# Patient Record
Sex: Female | Born: 1985 | Race: White | Hispanic: No | Marital: Married | Smoking: Former smoker
Health system: Southern US, Community
[De-identification: ages and names within clinical notes are randomized; demographics above are authoritative.]

## PROBLEM LIST (undated history)

## (undated) DIAGNOSIS — Z9889 Other specified postprocedural states: Secondary | ICD-10-CM

## (undated) DIAGNOSIS — R112 Nausea with vomiting, unspecified: Secondary | ICD-10-CM

## (undated) DIAGNOSIS — E282 Polycystic ovarian syndrome: Secondary | ICD-10-CM

## (undated) HISTORY — PX: TONSILLECTOMY: SUR1361

---
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
## 2007-03-30 ENCOUNTER — Inpatient Hospital Stay (HOSPITAL_COMMUNITY): Admission: AD | Admit: 2007-03-30 | Discharge: 2007-03-30 | Payer: Self-pay | Admitting: Obstetrics & Gynecology

## 2007-05-16 ENCOUNTER — Emergency Department (HOSPITAL_COMMUNITY): Admission: EM | Admit: 2007-05-16 | Discharge: 2007-05-16 | Payer: Self-pay | Admitting: Emergency Medicine

## 2007-05-26 ENCOUNTER — Ambulatory Visit (HOSPITAL_COMMUNITY): Admission: RE | Admit: 2007-05-26 | Discharge: 2007-05-26 | Payer: Self-pay | Admitting: Obstetrics

## 2007-07-30 ENCOUNTER — Ambulatory Visit (HOSPITAL_COMMUNITY): Admission: RE | Admit: 2007-07-30 | Discharge: 2007-07-30 | Payer: Self-pay | Admitting: Obstetrics

## 2007-10-28 ENCOUNTER — Inpatient Hospital Stay (HOSPITAL_COMMUNITY): Admission: AD | Admit: 2007-10-28 | Discharge: 2007-11-02 | Payer: Self-pay | Admitting: Obstetrics & Gynecology

## 2007-11-03 ENCOUNTER — Encounter: Admission: RE | Admit: 2007-11-03 | Discharge: 2007-12-02 | Payer: Self-pay | Admitting: Obstetrics

## 2008-05-13 IMAGING — US US OB COMP LESS 14 WK
1 series · 14 of 28 positions shown · non-contrast
Comparison: none

CLINICAL DATA: Abnormal uterine bleeding.  Early pregnancy.  
 OBSTETRICAL ULTRASOUND <14 WKS:
TECHNIQUE: Transabdominal ultrasound was performed for evaluation of the gestation as well as the maternal uterus and adnexal regions.

[Series 1: us ob comp less 14 wks · 14 of 29 slices shown]
[im 2/29]
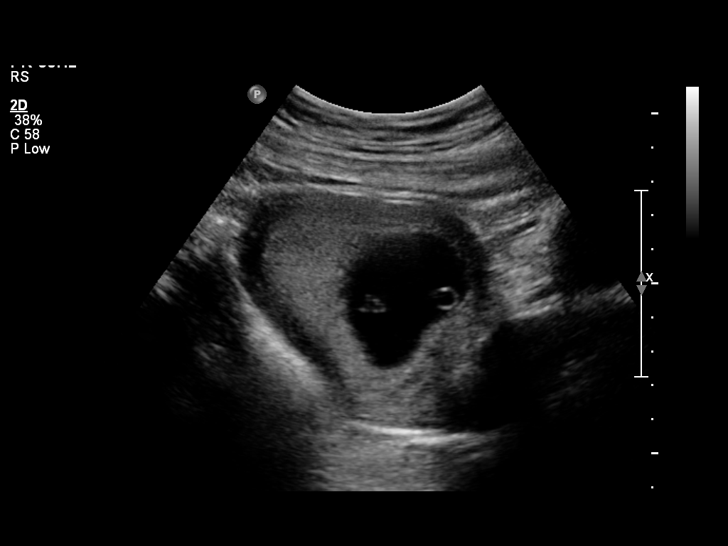
[im 4/29]
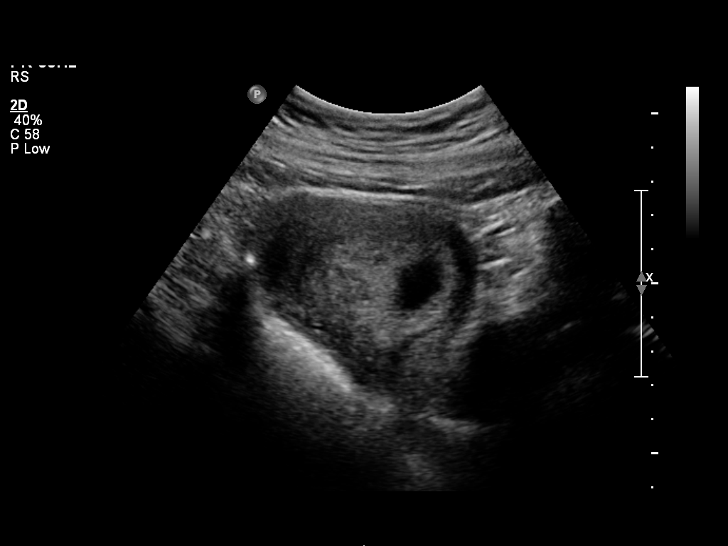
[im 6/29]
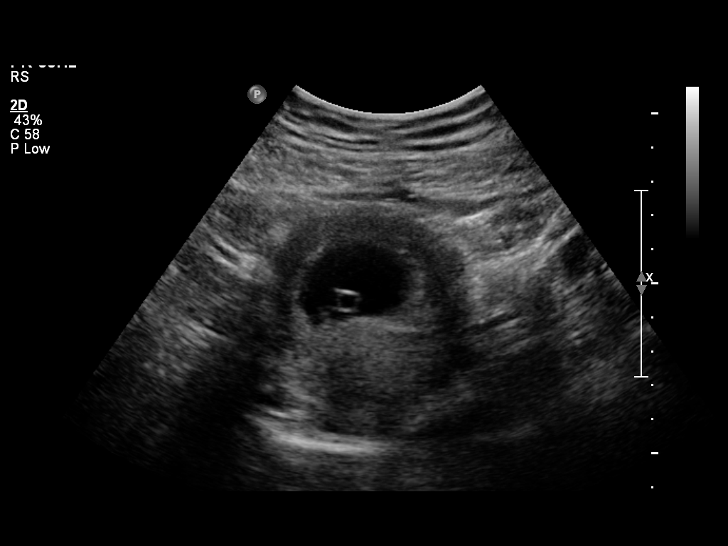
[im 8/29]
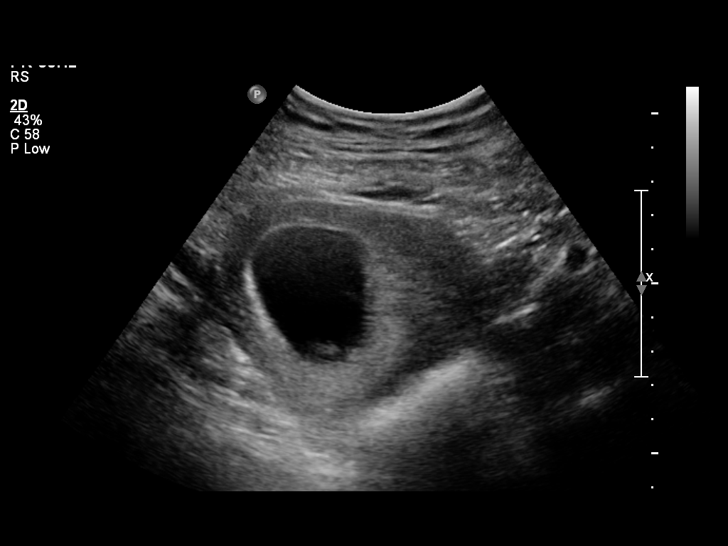
[im 10/29]
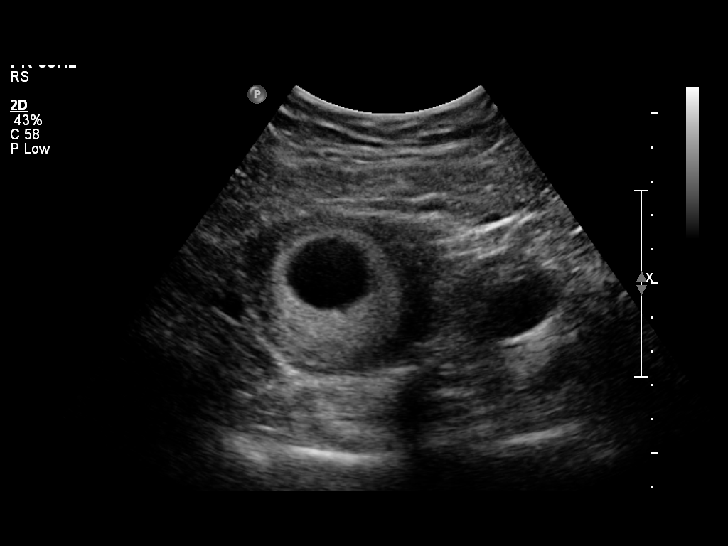
[im 12/29]
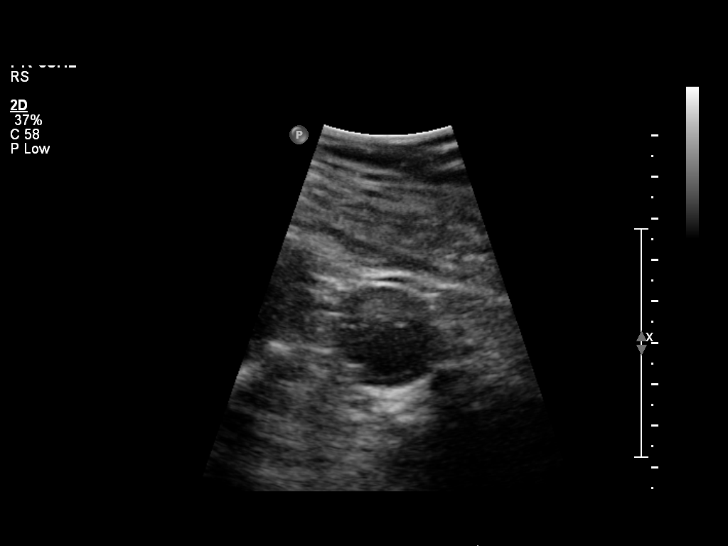
[im 14/29]
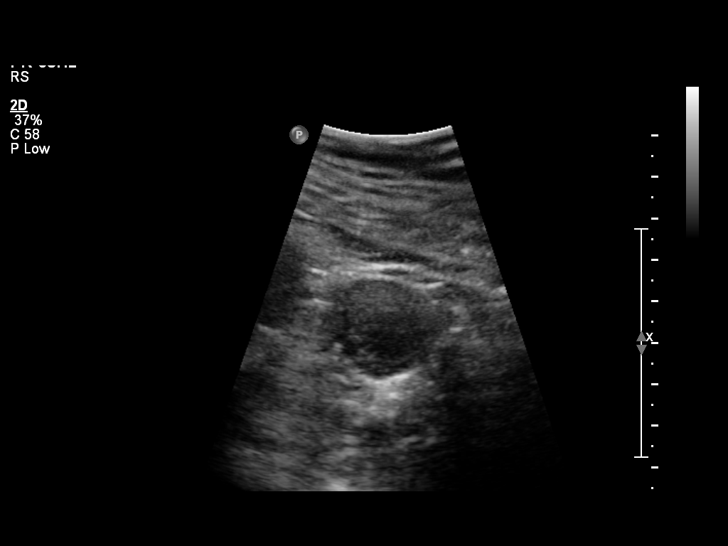
[im 16/29]
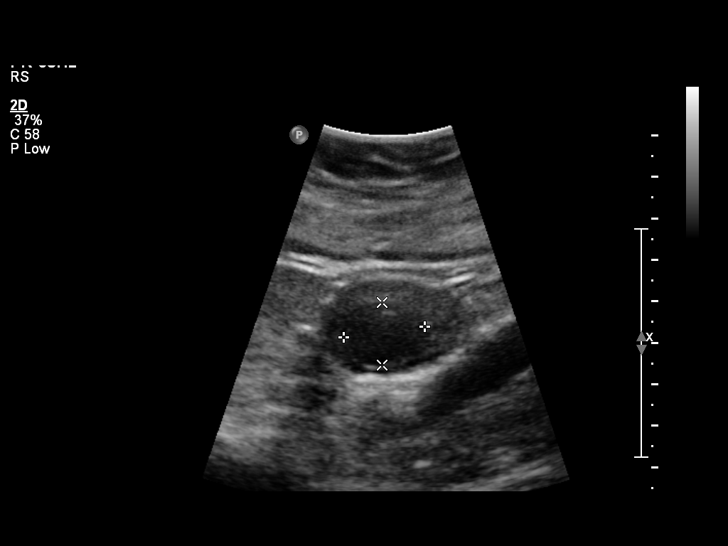
[im 18/29]
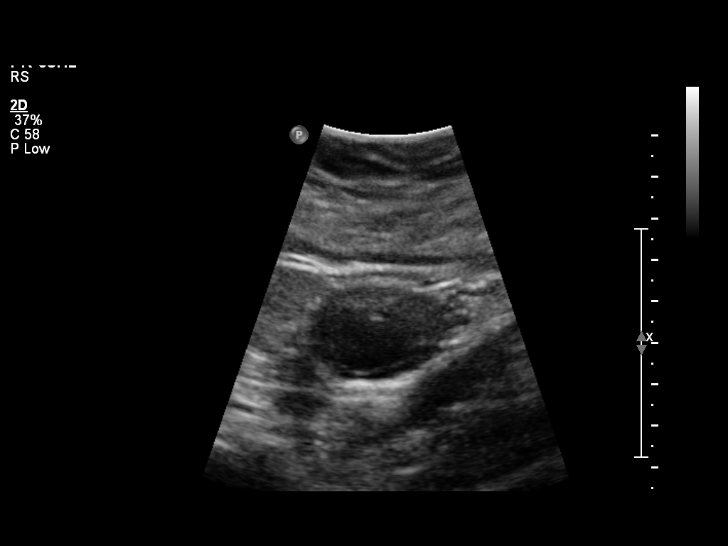
[im 20/29]
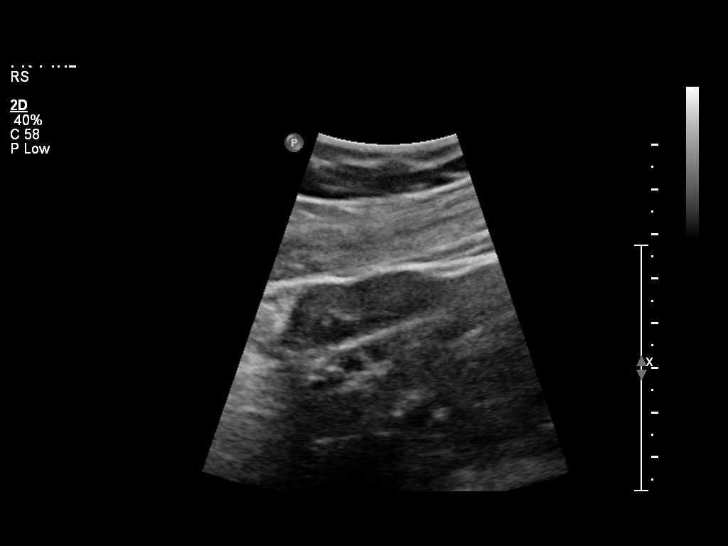
[im 22/29]
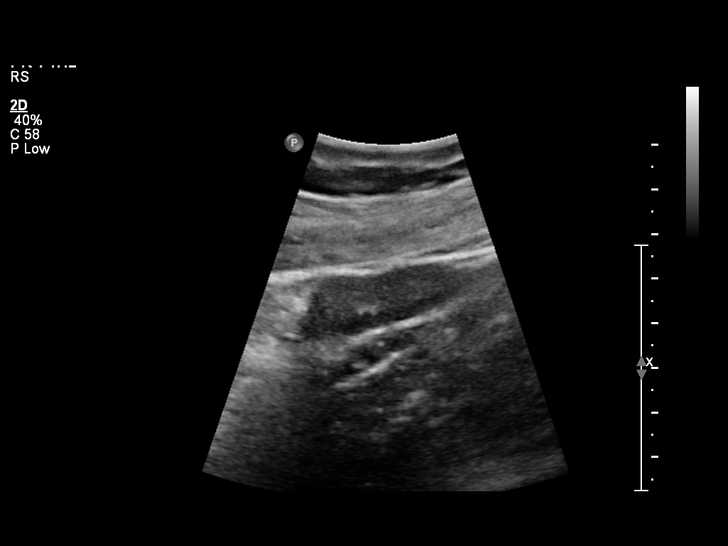
[im 24/29]
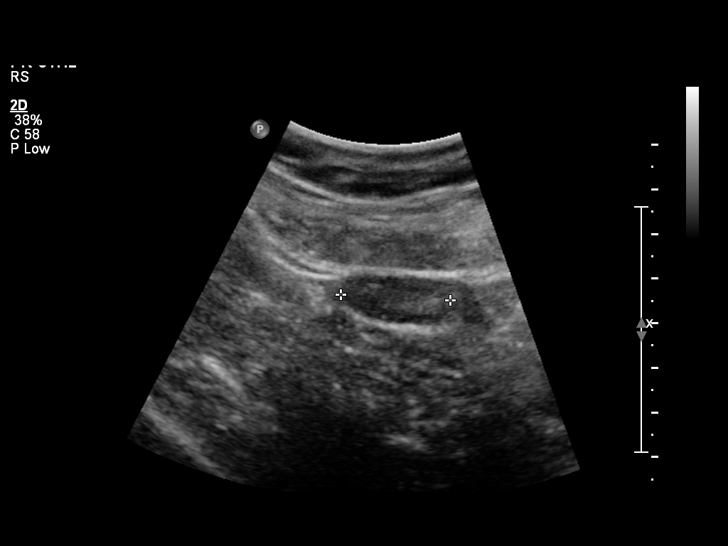
[im 26/29]
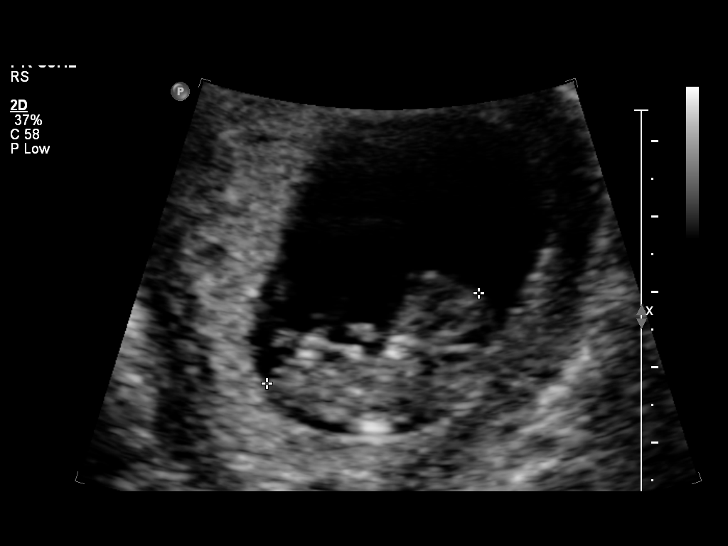
[im 29/29]
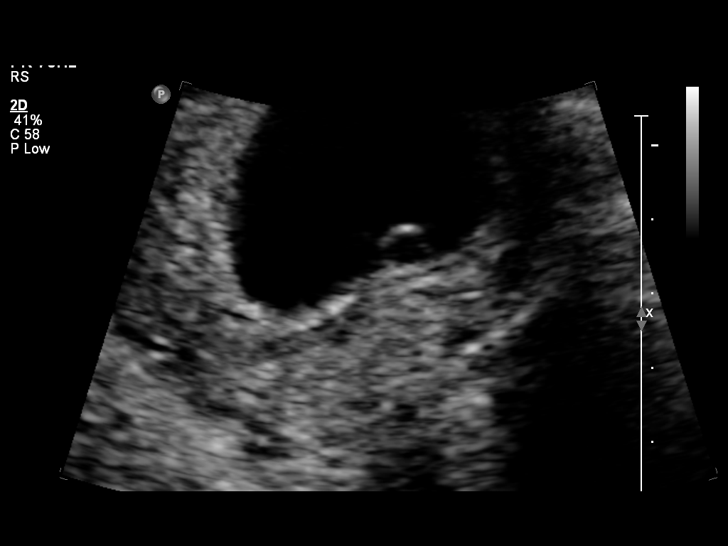

[14 of 28 positions shown; findings below may reference images not displayed]

FINDINGS: There is a single intrauterine gestation.  Yolk sac, embryo, and cardiac activity are visible.  Heart rate is 172 bpm.  Crown-rump length is 3.06 cm consistent with 10 weeks 0 days gestation.  
 No subchorionic hemorrhage.  The ovaries are normal.  No free fluid.
IMPRESSION: Normal appearing single intrauterine pregnancy of approximately 10 weeks 0 days gestation.  Specifically, no subchorionic hemorrhage.

## 2008-09-12 IMAGING — US US ABDOMEN COMPLETE
1 series · 14 of 25 positions shown · non-contrast
Comparison: None

CLINICAL DATA: Right upper quadrant abdominal pain.  28 weeks
pregnant.

COMPLETE ABDOMINAL ULTRASOUND
TECHNIQUE: Complete abdominal ultrasound examination was performed
including evaluation of the liver, gallbladder, bile ducts,
pancreas, kidneys, spleen, IVC, and abdominal aorta.

[Series 1: us abdomen complete · 0.30mm/px · 14 of 60 slices shown]
[im 1/60]
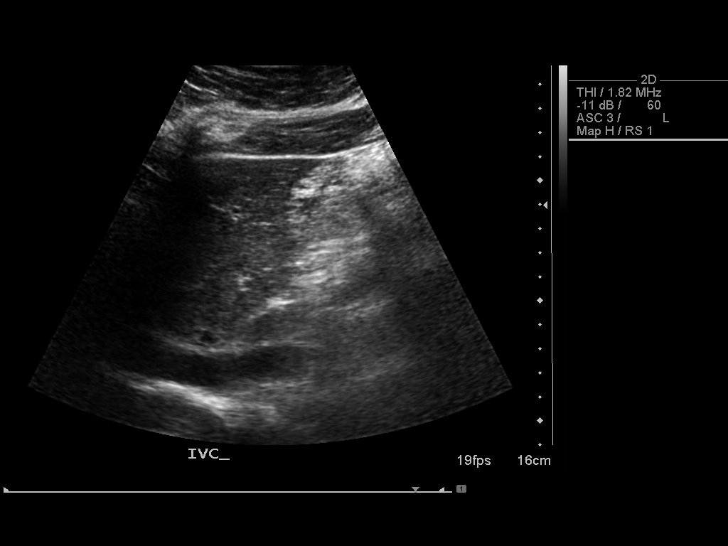
[im 5/60]
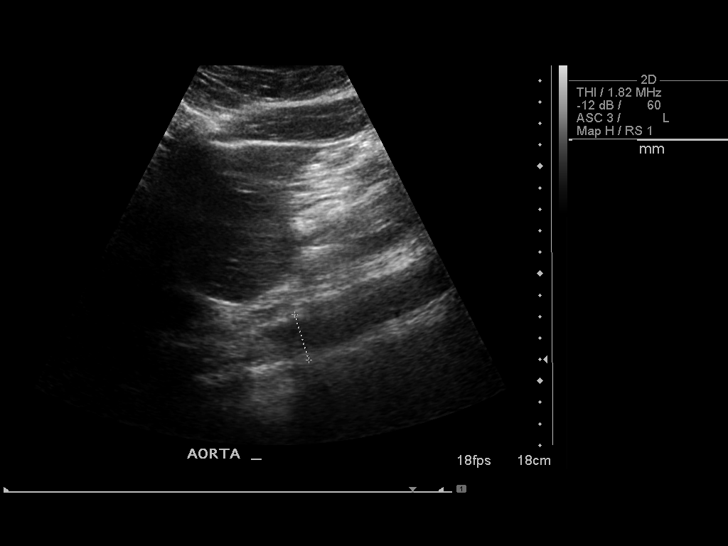
[im 10/60]
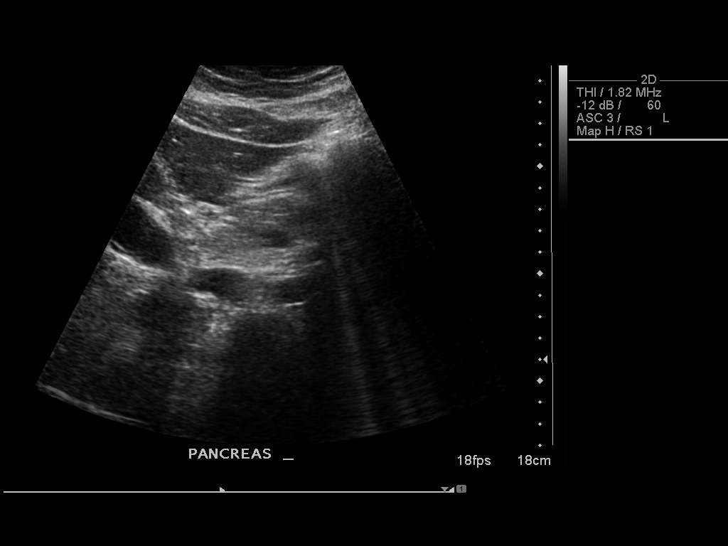
[im 15/60]
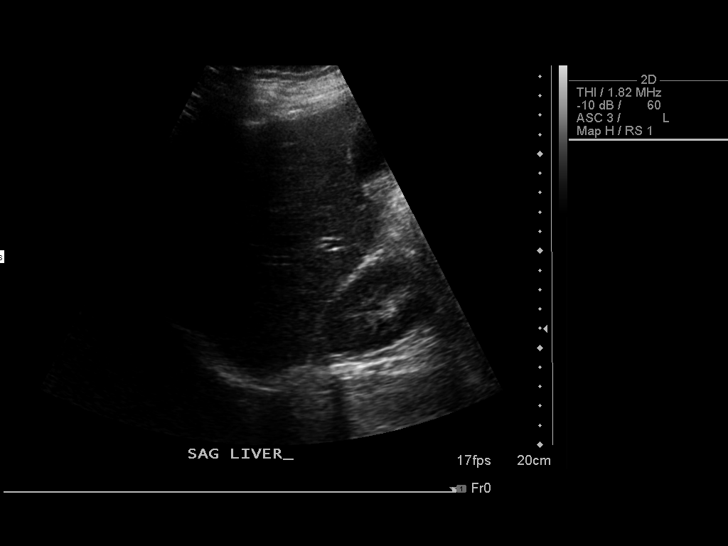
[im 20/60]
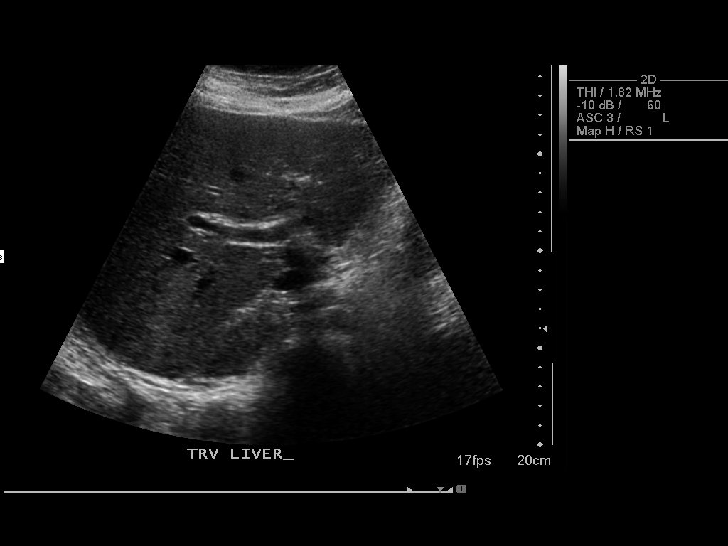
[im 23/60]
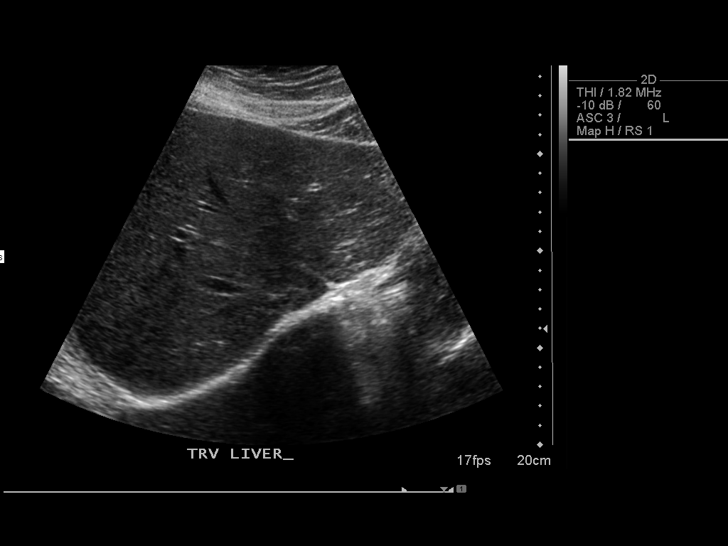
[im 28/60]
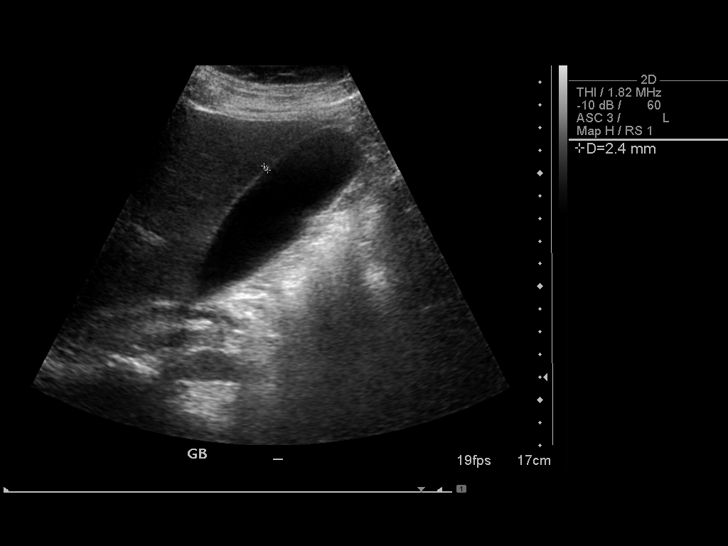
[im 32/60]
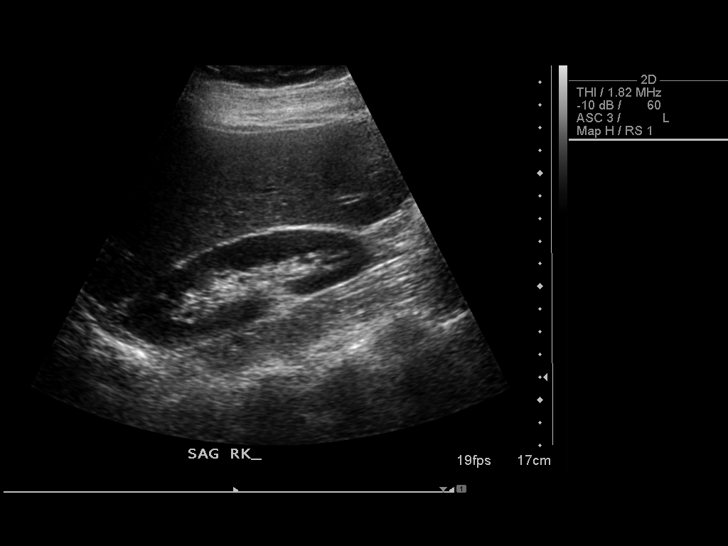
[im 37/60]
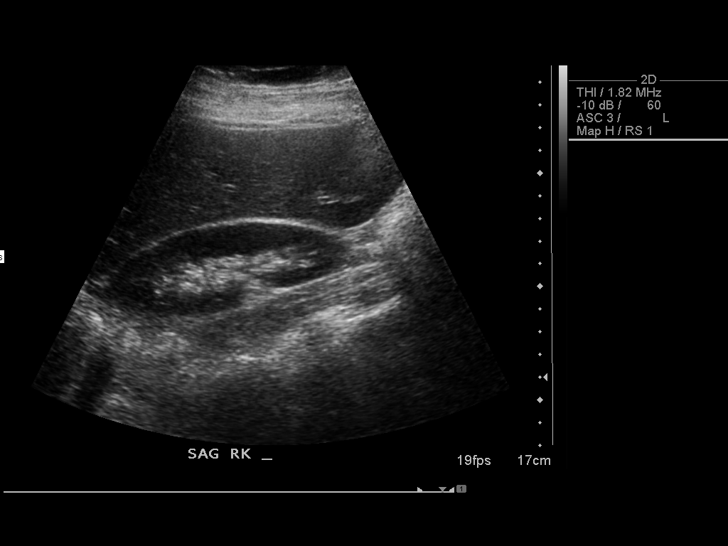
[im 40/60]
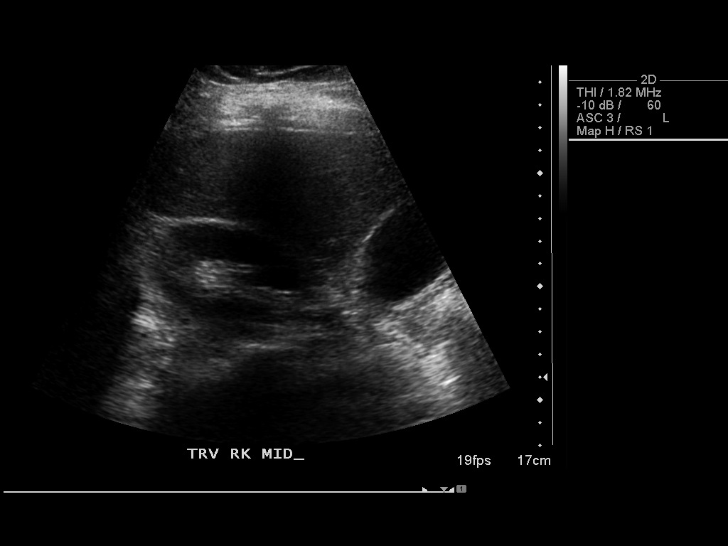
[im 45/60]
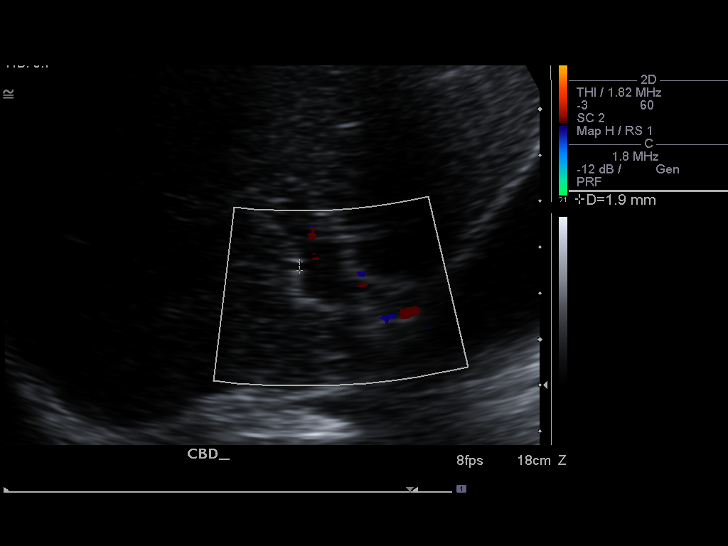
[im 50/60]
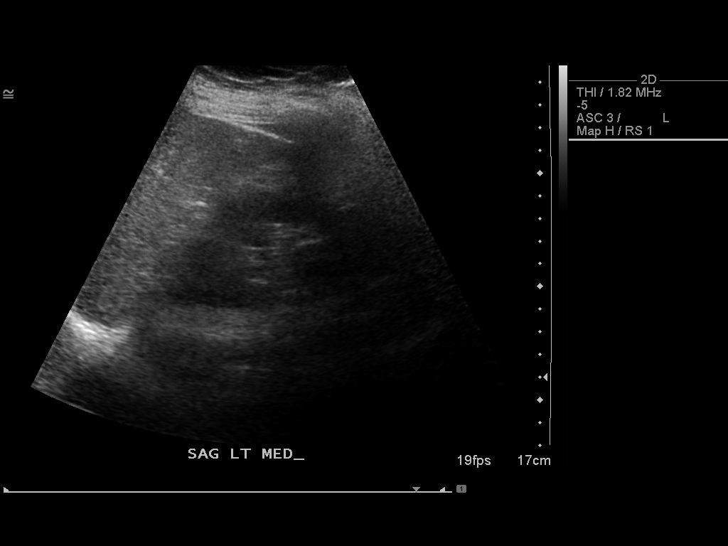
[im 55/60]
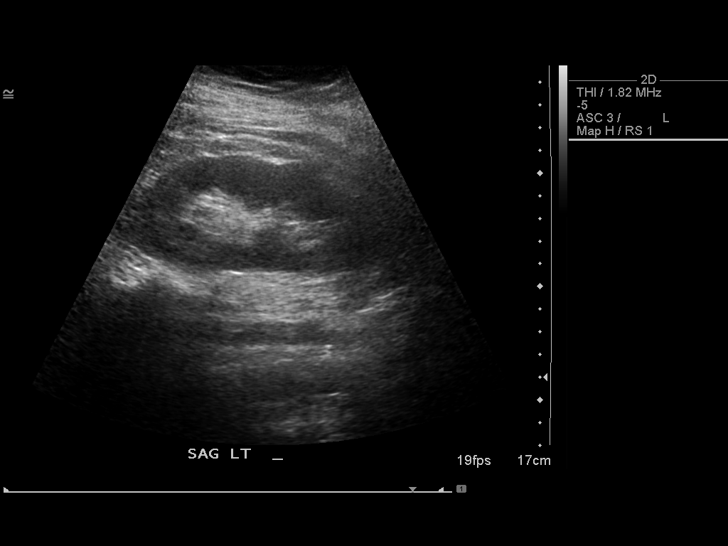
[im 60/60]
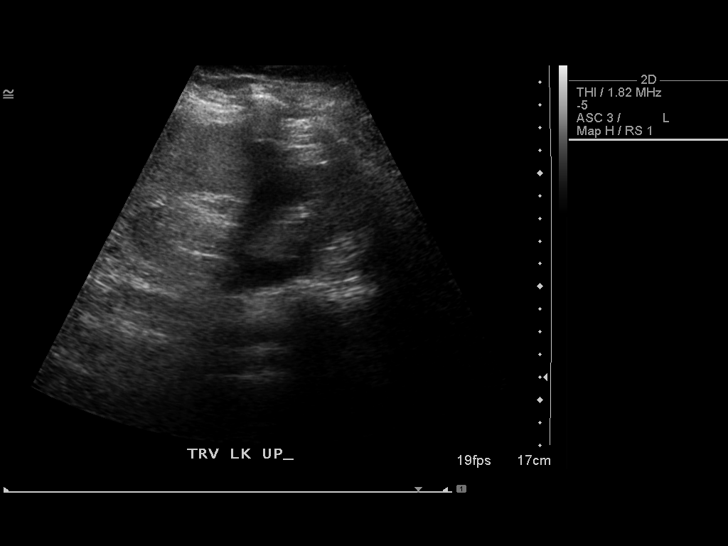

[14 of 25 positions shown; findings below may reference images not displayed]

FINDINGS: Gallbladder:  No gallstones, gallbladder wall thickening, or
pericholecystic fluid.

Common bile duct: Within normal limits in caliber.

Liver:  No focal parenchymal abnormalities.  Within normal limits
in parenchymal echogenicity.

Inferior vena cava:  Visualized portion unremarkable.

Pancreas:  Visualized portion unremarkable.

Spleen:  Within normal limits in size and echogenicity.

Right kidney:  Within normal limits in size and echogenicity. No
evidence of mass or hydronephrosis.

Left kidney:  Within normal limits in size and echogenicity. No
evidence of mass or hydronephrosis.

Abdominal aorta:  Within normal limits in caliber.
IMPRESSION: Negative abdominal ultrasound. No evidence of gallstones or
hydronephrosis.

## 2010-07-04 NOTE — Op Note (Signed)
[name], [name]               ACCOUNT NO.:  [id number]   MEDICAL RECORD NO.:  [id number]          PATIENT TYPE:  INP   LOCATION:  9142                          FACILITY:  [facility]   PHYSICIAN:  Roseanna Rainbow, M.D.DATE OF BIRTH:  [birth year]   DATE OF PROCEDURE:  10/30/2007  DATE OF DISCHARGE:                               OPERATIVE REPORT   PREOPERATIVE DIAGNOSIS:  Intrauterine pregnancy at term, latent labor,  and suspicious fetal heart tracing with repetitive moderate variable  decelerations.   POSTOPERATIVE DIAGNOSIS:  Intrauterine pregnancy at term, latent labor,  and suspicious fetal heart tracing with repetitive moderate variable  decelerations.   PROCEDURE:  Primary low uterine flap elliptical cesarean delivery via  Pfannenstiel skin incision.   SURGEON:  Roseanna Rainbow, M.D.   ANESTHESIA:  Epidural.   ESTIMATED BLOOD LOSS:  600 mL.   COMPLICATIONS:  None.   IV FLUID AND URINE OUTPUT:  As per anesthesiology.   PROCEDURE:  The patient was taken to the operating room with an IV  running in an epidural catheter in situ.  The patient was placed in the  dorsal supine position with leftward tilt and prepped and draped in the  usual sterile fashion.  After the time out had been completed, the  incision site was infiltrated with 10 mL of 0.25% Marcaine.  An incision  was then made with a scalpel and then carried down to the underlying  fascia with the Bovie.  The fascia was nicked in the midline.  The  fascial incision was then extended bilaterally with curved Mayo  scissors.  The superior aspect of the fascial incision was tented up and  the underlying rectus muscles dissected off.  The inferior aspect of the  fascial incision was manipulated in a similar fashion.  The rectus  muscles were separated in the midline.  The parietal peritoneum was  tented up and entered sharply.  This incision was then extended  superiorly and inferiorly with good visualization  of the bladder.  The  Alexis retractor was then placed into the incision.  The vesicouterine  peritoneum was tented up and entered sharply.  This incision was then  extended bilaterally and the bladder flap created bluntly.  The lower  uterine segment was incised in transverse fashion with a scalpel.  This  incision was then extended bluntly.  The infant's head was delivered  atraumatically.  The oropharynx was suctioned with a bulb suction.  The  cord was clamped and cut.  The infant was handed off to the awaiting  neonatologist.  The placenta was then removed.  The intrauterine cavity  was then evacuated of any remaining amniotic fluid clots and debridement  with moist laparotomy sponge.  The uterine incision was then  reapproximated in running interlocking fashion using suture of 0  Monocryl.  A second imbricating layer of the same suture was then  placed.  Individual bleeding points were secured with figure-of-eight  sutures of 2-0 Vicryl.  The paracolic gutters were then irrigated.  The  parietal peritoneum was then reapproximated in running fashion using 2-0  Vicryl.  This was subsequent to the Alexis retractor being removed  from the abdomen as well as laparotomy packs used to pack away the  sigmoid colon.  The fascia was closed in a running fashion using 0  Vicryl.  The skin was closed with staples.  At the close of the  procedure, the instrument and pack counts were said to be correct x2.  The patient was taken to the PACU, awake and in stable condition.      Roseanna Rainbow, M.D.  Electronically Signed     LAJ/MEDQ  D:  10/30/2007  T:  10/31/2007  Job:  [id number]

## 2010-07-04 NOTE — Discharge Summary (Signed)
[name], [name]               ACCOUNT NO.:  [id number]   MEDICAL RECORD NO.:  [id number]          PATIENT TYPE:  INP   LOCATION:  9142                          FACILITY:  [facility]   PHYSICIAN:  Charles A. Clearance Coots, M.D.DATE OF BIRTH:  1985/07/25   DATE OF ADMISSION:  10/28/2007  DATE OF DISCHARGE:  11/02/2007                               DISCHARGE SUMMARY   ADMITTING DIAGNOSES:  1. Postdates pregnancy.  2. Induction of labor.   DISCHARGE DIAGNOSES:  1. Postdates pregnancy.  2. Induction of labor.  3. Status post primary low transverse cesarean section on October 30, 2007 for suspicious fetal heart tracing with repetitive      moderate variable decelerations, latent phase of labor.  Viable      female delivered at 1521, Apgars of 9 at one minute and 9 at five      minutes, weight of 2970 g, length of 54.10 cm.  Mother and infant      discharged home in good condition.   REASON FOR ADMISSION:  An 25 year old para 0 with intrauterine pregnancy  of [redacted] weeks gestation for induction of labor for postdates pregnancy.   PAST MEDICAL HISTORY:  Surgery, tonsils and adenoids.  Illnesses, none.   MEDICATIONS:  Prenatal vitamins.   ALLERGIES:  No known drug allergies.   SOCIAL HISTORY:  Single, living with significant other.  Positive  tobacco.  Negative alcohol or recreational drug use.   FAMILY HISTORY:  Positive for alcoholism, cancer, diabetes,  hypertension, and kidney stones.   PHYSICAL EXAMINATION:  GENERAL:  A well-nourished, well-developed female  in no acute distress, afebrile.  VITAL SIGNS:  Stable.  LUNGS:  Clear to auscultation bilaterally.  HEART:  Regular rate and rhythm.  ABDOMEN:  Gravid, nontender.  Cervix long and closed and the vertex at  minus 3 station.   ADMITTING LABS:  Hemoglobin 13, hematocrit 38, white blood cell count  8800, and platelets 220,000.  RPR is nonreactive.   HOSPITAL COURSE:  The patient was admitted for two-stage induction of  labor.   She received cervical ripening with Cytotec and Pitocin started  following morning.  Foley bulb was inserted in the cervix past the  internal os for further cervical ripening.  The patient progressed to 4  cm dilatation then had the onset of a variable fetal heart rate  decelerations and vaginal bleeding.  A decision was made to proceed with  cesarean section delivery for repetitive moderate variable fetal heart  rate decelerations and latent phase of labor.  Primary low transverse  cesarean section was performed without complications.  Postoperative  course was uncomplicated.  The patient was discharged home on postop day  #3 in good condition.   DISCHARGE LABS:  Hemoglobin 11, hematocrit 33, white blood cell count  9100, and platelets 172,000.   DISCHARGE DISPOSITION:  Medications, Tylox and ibuprofen was prescribed  for pain.  Continue prenatal vitamins.  Routine written instructions  were given for discharge after cesarean section.  The patient is to call  office for followup appointment 2 weeks.  Charles A. Clearance Coots, M.D.  Electronically Signed     CAH/MEDQ  D:  11/02/2007  T:  11/02/2007  Job:  [id number]

## 2010-11-13 LAB — URINALYSIS, ROUTINE W REFLEX MICROSCOPIC
Hgb urine dipstick: NEGATIVE
Nitrite: NEGATIVE
Protein, ur: NEGATIVE
Specific Gravity, Urine: 1.016
Urobilinogen, UA: 1

## 2010-11-13 LAB — URINE MICROSCOPIC-ADD ON

## 2010-11-22 LAB — CBC
HCT: 33.8 — ABNORMAL LOW
HCT: 38.2
Hemoglobin: 11.5 — ABNORMAL LOW
MCHC: 34
MCHC: 34.2
MCV: 92.3
Platelets: 172
Platelets: 220
RBC: 4.14
RDW: 14.7
WBC: 8.8

## 2011-05-15 ENCOUNTER — Inpatient Hospital Stay (HOSPITAL_COMMUNITY)
Admission: AD | Admit: 2011-05-15 | Discharge: 2011-05-16 | Disposition: A | Discharge status: Home / Self Care | Payer: BC Managed Care – PPO | Attending: Obstetrics and Gynecology | Admitting: Obstetrics and Gynecology

## 2011-05-15 DIAGNOSIS — O209 Hemorrhage in early pregnancy, unspecified: Secondary | ICD-10-CM | POA: Insufficient documentation

## 2011-05-15 DIAGNOSIS — O4690 Antepartum hemorrhage, unspecified, unspecified trimester: Secondary | ICD-10-CM

## 2011-05-16 ENCOUNTER — Encounter (HOSPITAL_COMMUNITY): Payer: Self-pay | Admitting: *Deleted

## 2011-05-16 ENCOUNTER — Inpatient Hospital Stay (HOSPITAL_COMMUNITY): Payer: BC Managed Care – PPO

## 2011-05-16 LAB — CBC
Hemoglobin: 13.7 g/dL (ref 12.0–15.0)
MCHC: 34.6 g/dL (ref 30.0–36.0)
RBC: 4.48 MIL/uL (ref 3.87–5.11)
WBC: 9.4 10*3/uL (ref 4.0–10.5)

## 2011-05-16 LAB — HCG, QUANTITATIVE, PREGNANCY: hCG, Beta Chain, Quant, S: 13991 m[IU]/mL — ABNORMAL HIGH (ref <5)

## 2011-05-16 LAB — URINALYSIS, ROUTINE W REFLEX MICROSCOPIC
Bilirubin Urine: NEGATIVE
Ketones, ur: NEGATIVE mg/dL
Protein, ur: NEGATIVE mg/dL
Urobilinogen, UA: 0.2 mg/dL (ref 0.0–1.0)

## 2011-05-16 LAB — WET PREP, GENITAL
Clue Cells Wet Prep HPF POC: NONE SEEN
Trich, Wet Prep: NONE SEEN
Yeast Wet Prep HPF POC: NONE SEEN

## 2011-05-16 LAB — GC/CHLAMYDIA PROBE AMP, GENITAL: GC Probe Amp, Genital: NEGATIVE

## 2011-05-16 LAB — ABO/RH: ABO/RH(D): O POS

## 2011-05-16 NOTE — MAU Provider Note (Signed)
Agree with above note.  [name] 05/16/2011 7:46 AM

## 2011-05-16 NOTE — MAU Provider Note (Signed)
History   Pt presents today c/o vag bleeding in preg. She states she first noticed dark red bleeding that began several days ago, but she became worried today when she noticed the bleeding was a little brighter. She reports her last episode of intercourse was about 5 days ago. She denies vag irritation, abd pain, or any other sx at this time.  CSN: [id number]  Arrival date and time: 05/15/11 2343   None     No chief complaint on file.  HPI  OB History    Grav Para Term Preterm Abortions TAB SAB Ect Mult Living   1 0 0       1      No past medical history on file.  No past surgical history on file.  No family history on file.  History  Substance Use Topics  . Smoking status: Not on file  . Smokeless tobacco: Not on file  . Alcohol Use: Not on file    Allergies: No Known Allergies  Prescriptions prior to admission  Medication Sig Dispense Refill  . acetaminophen (TYLENOL) 500 MG tablet Take 500 mg by mouth every 6 (six) hours as needed. For pain      . Prenatal Vit-Fe Fumarate-FA (PRENATAL MULTIVITAMIN) TABS Take 1 tablet by mouth every morning.        Review of Systems  Constitutional: Negative for fever.  Eyes: Negative for blurred vision and double vision.  Respiratory: Negative for cough, hemoptysis, sputum production, shortness of breath and wheezing.   Cardiovascular: Negative for chest pain and palpitations.  Gastrointestinal: Negative for nausea, vomiting, abdominal pain, diarrhea and constipation.  Genitourinary: Negative for dysuria, urgency, frequency and hematuria.  Neurological: Negative for dizziness and headaches.  Psychiatric/Behavioral: Negative for depression and suicidal ideas.   Physical Exam   Blood pressure 118/78, pulse 81, temperature 97.5 F (36.4 C), temperature source Oral, resp. rate 20, height 5\' 5"  (1.651 m), weight 224 lb 6 oz (101.776 kg), last menstrual period 03/25/2011, unknown if currently breastfeeding.  Physical Exam    Nursing note and vitals reviewed. Constitutional: She is oriented to person, place, and time. She appears well-developed and well-nourished. No distress.  HENT:  Head: Normocephalic and atraumatic.  Eyes: EOM are normal. Pupils are equal, round, and reactive to light.  GI: Soft. She exhibits no distension and no mass. There is no tenderness. There is no rebound and no guarding.  Genitourinary: There is bleeding around the vagina. Vaginal discharge found.       Minimal amount of dark red blood in vag vault. Cervix Lg/closed.  Neurological: She is alert and oriented to person, place, and time.  Skin: Skin is warm and dry. She is not diaphoretic.  Psychiatric: She has a normal mood and affect. Her behavior is normal. Judgment and thought content normal.    MAU Course  Procedures  Wet prep and GC/Chlamydia cultures done.  Results for orders placed during the hospital encounter of 05/15/11 (from the past 24 hour(s))  URINALYSIS, ROUTINE W REFLEX MICROSCOPIC     Status: Abnormal   Collection Time   05/16/11  1:19 AM      Component Value Range   Color, Urine YELLOW  YELLOW    APPearance CLEAR  CLEAR    Specific Gravity, Urine 1.020  1.005 - 1.030    pH 6.5  5.0 - 8.0    Glucose, UA NEGATIVE  NEGATIVE (mg/dL)   Hgb urine dipstick LARGE (*) NEGATIVE    Bilirubin Urine NEGATIVE  NEGATIVE    Ketones, ur NEGATIVE  NEGATIVE (mg/dL)   Protein, ur NEGATIVE  NEGATIVE (mg/dL)   Urobilinogen, UA 0.2  0.0 - 1.0 (mg/dL)   Nitrite NEGATIVE  NEGATIVE    Leukocytes, UA NEGATIVE  NEGATIVE   URINE MICROSCOPIC-ADD ON     Status: Abnormal   Collection Time   05/16/11  1:19 AM      Component Value Range   Squamous Epithelial / LPF FEW (*) RARE    RBC / HPF 0-2  <3 (RBC/hpf)   Bacteria, UA FEW (*) RARE   POCT PREGNANCY, URINE     Status: Abnormal   Collection Time   05/16/11  1:28 AM      Component Value Range   Preg Test, Ur POSITIVE (*) NEGATIVE   ABO/RH     Status: Normal (Preliminary result)    Collection Time   05/16/11  2:50 AM      Component Value Range   ABO/RH(D) O POS    HCG, QUANTITATIVE, PREGNANCY     Status: Abnormal   Collection Time   05/16/11  2:50 AM      Component Value Range   hCG, Beta Chain, Quant, S 13991 (*) <5 (mIU/mL)  CBC     Status: Normal   Collection Time   05/16/11  2:50 AM      Component Value Range   WBC 9.4  4.0 - 10.5 (K/uL)   RBC 4.48  3.87 - 5.11 (MIL/uL)   Hemoglobin 13.7  12.0 - 15.0 (g/dL)   HCT 96.0  45.4 - 09.8 (%)   MCV 88.4  78.0 - 100.0 (fL)   MCH 30.6  26.0 - 34.0 (pg)   MCHC 34.6  30.0 - 36.0 (g/dL)   RDW 11.9  14.7 - 82.9 (%)   Platelets 219  150 - 400 (K/uL)   US Ob Comp Less 14 Wks  05/16/2011  *RADIOLOGY REPORT*  Clinical Data: Vaginal bleeding.  OBSTETRIC <14 WK [place] AND TRANSVAGINAL OB US  Technique:  Both transabdominal and transvaginal ultrasound examinations were performed for complete evaluation of the gestation as well as the maternal uterus, adnexal regions, and pelvic cul-de-sac.  Transvaginal technique was performed to assess early pregnancy.  Comparison:  Prior ultrasound of pregnancy performed 05/26/2007  Intrauterine gestational sac:  Visualized/normal in shape. Yolk sac: Yes Embryo: No Cardiac Activity: N/A  MSD: 1.1 cm  5 w  6 d         [place] EDC: 01/10/2012  Maternal uterus/adnexae: A small amount of subchorionic hemorrhage is noted.  The uterus is otherwise unremarkable in appearance.  The ovaries are only characterized on transabdominal approach, seen measuring 4.0 x 2.6 x 2.7 cm on the right, and 4.1 x 1.9 x 2.2 cm on the left.  No suspicious adnexal masses are seen.  There is no evidence for ovarian torsion.  No free fluid is seen within the pelvic cul-de-sac.  IMPRESSION:  1.  Single intrauterine gestational sac, with a mean sac diameter of 1.1 cm, corresponding to a gestational age of [redacted] weeks 6 days. This does not match the gestational age by LMP, and reflects a new estimated date of delivery of January 10, 2012.  The  embryo is not yet visualized. 2.  Small amount of subchorionic hemorrhage noted.  Original Report Authenticated By: Tonia Ghent, M.D.   US Ob Transvaginal  05/16/2011  *RADIOLOGY REPORT*  Clinical Data: Vaginal bleeding.  OBSTETRIC <14 WK [place] AND TRANSVAGINAL OB US  Technique:  Both transabdominal and transvaginal ultrasound examinations were performed for complete evaluation of the gestation as well as the maternal uterus, adnexal regions, and pelvic cul-de-sac.  Transvaginal technique was performed to assess early pregnancy.  Comparison:  Prior ultrasound of pregnancy performed 05/26/2007  Intrauterine gestational sac:  Visualized/normal in shape. Yolk sac: Yes Embryo: No Cardiac Activity: N/A  MSD: 1.1 cm  5 w  6 d         [place] EDC: 01/10/2012  Maternal uterus/adnexae: A small amount of subchorionic hemorrhage is noted.  The uterus is otherwise unremarkable in appearance.  The ovaries are only characterized on transabdominal approach, seen measuring 4.0 x 2.6 x 2.7 cm on the right, and 4.1 x 1.9 x 2.2 cm on the left.  No suspicious adnexal masses are seen.  There is no evidence for ovarian torsion.  No free fluid is seen within the pelvic cul-de-sac.  IMPRESSION:  1.  Single intrauterine gestational sac, with a mean sac diameter of 1.1 cm, corresponding to a gestational age of [redacted] weeks 6 days. This does not match the gestational age by LMP, and reflects a new estimated date of delivery of January 10, 2012.  The embryo is not yet visualized. 2.  Small amount of subchorionic hemorrhage noted.  Original Report Authenticated By: Tonia Ghent, M.D.     Assessment and Plan  Vag bleeding in preg: discussed with pt at length. She will f/u with Dr. Clearance Coots. She has her first OB appt scheduled. Discussed diet, activity, risks, and precautions. Discussed subchorionic hemorrhage with pt at length. Advised pt to avoid intercourse.  Clinton Gallant. Adelina Collard III, DrHSc, MPAS, PA-C  05/16/2011, 3:40 AM

## 2011-05-16 NOTE — MAU Note (Signed)
PT SAYS  ON Sunday  SHE STARTED HAVING [facility]OWN D/C.- THOUGHT WAIT TILL DR APPOINTMENT -  BUT TONIGHT - HAD BRIGHT RED VAG BLEEDING.    SAYS NO PAD ON.. HAS APPOINTMENT  WITH DR HARPER ON 05-28-2011

## 2011-05-16 NOTE — Discharge Instructions (Signed)
Vaginal Bleeding During Pregnancy  A small amount of bleeding from the vagina can happen anytime during pregnancy. Be sure to tell your doctor about all vaginal bleeding.   HOME CARE   Get plenty of rest and sleep.   Count the number of pads you use each day. Do not use tampons.   Save any tissue you pass for your doctor to see.   Do not exercise   Do not do any heavy lifting.   Avoid going up and down stairs. If you must climb stairs, go slowly.   Do not have sex (intercourse) or orgasms until approved by your doctor.   Do not douche.   Only take medicine as told by your doctor. Do not take aspirin.   Eat healthy.   Always keep your follow-up appointments.  GET HELP RIGHT AWAY IF:    You feel the baby moving less or not moving at all.   The bleeding gets worse.   You have very painful cramps or pain in your stomach or back.   You pass large clots or anything that looks like tissue.   You have a temperature by mouth above 102 F (38.9 C).   You feel very weak.   You have chills.   You feel dizzy or pass out (faint).   You have a gush of fluid from the vagina.  MAKE SURE YOU:    Understand these instructions.   Will watch your condition.   Will get help right away if you are not doing well or get worse.  Document Released: 11/15/2007 Document Revised: 01/25/2011 Document Reviewed: 01/11/2009  [employer] Patient Information 2012 [employer].

## 2011-06-01 ENCOUNTER — Other Ambulatory Visit: Payer: Self-pay | Admitting: Obstetrics

## 2011-06-01 DIAGNOSIS — Z8489 Family history of other specified conditions: Secondary | ICD-10-CM

## 2011-06-01 DIAGNOSIS — O26849 Uterine size-date discrepancy, unspecified trimester: Secondary | ICD-10-CM

## 2011-06-04 ENCOUNTER — Other Ambulatory Visit: Payer: Self-pay | Admitting: Obstetrics

## 2011-06-04 DIAGNOSIS — O2690 Pregnancy related conditions, unspecified, unspecified trimester: Secondary | ICD-10-CM

## 2011-06-07 ENCOUNTER — Ambulatory Visit (HOSPITAL_COMMUNITY): Payer: BC Managed Care – PPO

## 2011-06-08 ENCOUNTER — Ambulatory Visit (HOSPITAL_COMMUNITY): Admission: RE | Admit: 2011-06-08 | Payer: BC Managed Care – PPO | Source: Ambulatory Visit

## 2011-06-08 ENCOUNTER — Other Ambulatory Visit: Payer: Self-pay | Admitting: Obstetrics

## 2011-06-08 ENCOUNTER — Ambulatory Visit (HOSPITAL_COMMUNITY)
Admission: RE | Admit: 2011-06-08 | Discharge: 2011-06-08 | Disposition: A | Discharge status: Home / Self Care | Payer: BC Managed Care – PPO | Source: Ambulatory Visit | Attending: Obstetrics | Admitting: Obstetrics

## 2011-06-08 ENCOUNTER — Encounter (HOSPITAL_COMMUNITY): Payer: Self-pay

## 2011-06-08 DIAGNOSIS — O2690 Pregnancy related conditions, unspecified, unspecified trimester: Secondary | ICD-10-CM

## 2011-06-08 DIAGNOSIS — Z3689 Encounter for other specified antenatal screening: Secondary | ICD-10-CM | POA: Insufficient documentation

## 2011-06-08 HISTORY — DX: Polycystic ovarian syndrome: E28.2

## 2011-06-12 LAB — OB RESULTS CONSOLE ABO/RH

## 2011-06-12 LAB — OB RESULTS CONSOLE ANTIBODY SCREEN: Antibody Screen: NEGATIVE

## 2011-12-27 ENCOUNTER — Other Ambulatory Visit: Payer: Self-pay | Admitting: Obstetrics

## 2011-12-28 ENCOUNTER — Encounter (HOSPITAL_COMMUNITY): Payer: Self-pay | Admitting: Pharmacist

## 2012-01-01 ENCOUNTER — Encounter (HOSPITAL_COMMUNITY): Payer: Self-pay

## 2012-01-01 ENCOUNTER — Encounter (HOSPITAL_COMMUNITY)
Admission: RE | Admit: 2012-01-01 | Discharge: 2012-01-01 | Disposition: A | Discharge status: Home / Self Care | Payer: BC Managed Care – PPO | Source: Ambulatory Visit | Attending: Obstetrics | Admitting: Obstetrics

## 2012-01-01 HISTORY — DX: Nausea with vomiting, unspecified: R11.2

## 2012-01-01 HISTORY — DX: Other specified postprocedural states: Z98.890

## 2012-01-01 LAB — CBC
HCT: 39 % (ref 36.0–46.0)
Hemoglobin: 13.2 g/dL (ref 12.0–15.0)
MCH: 31.1 pg (ref 26.0–34.0)
MCHC: 33.8 g/dL (ref 30.0–36.0)
MCV: 91.8 fL (ref 78.0–100.0)
Platelets: 255 10*3/uL (ref 150–400)
RBC: 4.25 MIL/uL (ref 3.87–5.11)
RDW: 14.5 % (ref 11.5–15.5)
WBC: 8.5 10*3/uL (ref 4.0–10.5)

## 2012-01-01 LAB — RPR: RPR Ser Ql: NONREACTIVE

## 2012-01-01 LAB — SURGICAL PCR SCREEN
MRSA, PCR: NEGATIVE
Staphylococcus aureus: NEGATIVE

## 2012-01-01 NOTE — Patient Instructions (Addendum)
   Your procedure is scheduled [id number] November 14th  Enter through the Main Entrance of [facility] at:1145 am Pick up the phone at the desk and dial [phone] and inform us of your arrival.  Please call this number if you have any problems the morning of surgery: [phone]  Remember: Do not eat food after midnight:on Wednesday You may drink clear liquids until 9am then nothing   Do not wear jewelry, make-up, or FINGER nail polish No metal in your hair or on your body. Do not wear lotions, powders, perfumes. You may wear deodorant.  Please use your CHG wash as directed prior to surgery.  Do not shave anywhere for at least 12 hours prior to first CHG shower.  Do not bring valuables to the hospital.   Leave suitcase in the car. After Surgery it may be brought to your room. For patients being admitted to the hospital, checkout time is 11:00am the day of discharge.

## 2012-01-03 ENCOUNTER — Inpatient Hospital Stay (HOSPITAL_COMMUNITY): Payer: BC Managed Care – PPO | Admitting: Anesthesiology

## 2012-01-03 ENCOUNTER — Encounter: Payer: Self-pay | Admitting: Obstetrics

## 2012-01-03 ENCOUNTER — Encounter (HOSPITAL_COMMUNITY): Payer: Self-pay | Admitting: *Deleted

## 2012-01-03 ENCOUNTER — Encounter (HOSPITAL_COMMUNITY)
Admission: AD | Disposition: A | Discharge status: Home / Self Care | Payer: Self-pay | Source: Ambulatory Visit | Attending: Obstetrics

## 2012-01-03 ENCOUNTER — Other Ambulatory Visit: Payer: Self-pay | Admitting: Obstetrics

## 2012-01-03 ENCOUNTER — Inpatient Hospital Stay (HOSPITAL_COMMUNITY)
Admission: AD | Admit: 2012-01-03 | Discharge: 2012-01-05 | DRG: 371 | Disposition: A | Discharge status: Home / Self Care | Payer: BC Managed Care – PPO | Source: Ambulatory Visit | Attending: Obstetrics | Admitting: Obstetrics

## 2012-01-03 ENCOUNTER — Encounter (HOSPITAL_COMMUNITY): Payer: Self-pay | Admitting: Anesthesiology

## 2012-01-03 DIAGNOSIS — O34219 Maternal care for unspecified type scar from previous cesarean delivery: Principal | ICD-10-CM | POA: Diagnosis present

## 2012-01-03 DIAGNOSIS — Z01818 Encounter for other preprocedural examination: Secondary | ICD-10-CM

## 2012-01-03 DIAGNOSIS — Z98891 History of uterine scar from previous surgery: Secondary | ICD-10-CM

## 2012-01-03 DIAGNOSIS — Z01812 Encounter for preprocedural laboratory examination: Secondary | ICD-10-CM

## 2012-01-03 SURGERY — Surgical Case
Anesthesia: Spinal | Site: Abdomen | Wound class: Clean Contaminated

## 2012-01-03 MED ORDER — PHENYLEPHRINE 40 MCG/ML (10ML) SYRINGE FOR IV PUSH (FOR BLOOD PRESSURE SUPPORT)
PREFILLED_SYRINGE | INTRAVENOUS | Status: AC
  Filled 2012-01-03: qty 5

## 2012-01-03 MED ORDER — DIBUCAINE 1 % RE OINT: 1.0000 "application " | TOPICAL_OINTMENT | RECTAL | Status: DC | PRN

## 2012-01-03 MED ORDER — LACTATED RINGERS IV SOLN
Freq: Once | INTRAVENOUS | Status: AC
  Administered 2012-01-03 (×5): via INTRAVENOUS

## 2012-01-03 MED ORDER — NALOXONE HCL 0.4 MG/ML IJ SOLN: 0.4000 mg | INTRAMUSCULAR | Status: DC | PRN

## 2012-01-03 MED ORDER — SCOPOLAMINE 1 MG/3DAYS TD PT72
MEDICATED_PATCH | TRANSDERMAL | Status: AC
  Administered 2012-01-03: 1.5 mg via TRANSDERMAL
  Filled 2012-01-03: qty 1

## 2012-01-03 MED ORDER — NALBUPHINE HCL 10 MG/ML IJ SOLN
5.0000 mg | INTRAMUSCULAR | Status: DC | PRN
  Filled 2012-01-03: qty 1

## 2012-01-03 MED ORDER — HYDROMORPHONE HCL PF 1 MG/ML IJ SOLN: 1.0000 mg | Freq: Once | INTRAMUSCULAR | Status: DC

## 2012-01-03 MED ORDER — DIPHENHYDRAMINE HCL 25 MG PO CAPS: 25.0000 mg | ORAL_CAPSULE | Freq: Four times a day (QID) | ORAL | Status: DC | PRN

## 2012-01-03 MED ORDER — ONDANSETRON HCL 4 MG/2ML IJ SOLN: 4.0000 mg | Freq: Three times a day (TID) | INTRAMUSCULAR | Status: DC | PRN

## 2012-01-03 MED ORDER — ONDANSETRON HCL 4 MG/2ML IJ SOLN
INTRAMUSCULAR | Status: AC
  Filled 2012-01-03: qty 2

## 2012-01-03 MED ORDER — MORPHINE SULFATE 0.5 MG/ML IJ SOLN
INTRAMUSCULAR | Status: AC
  Filled 2012-01-03: qty 10

## 2012-01-03 MED ORDER — ZOLPIDEM TARTRATE 5 MG PO TABS: 5.0000 mg | ORAL_TABLET | Freq: Every evening | ORAL | Status: DC | PRN

## 2012-01-03 MED ORDER — SENNOSIDES-DOCUSATE SODIUM 8.6-50 MG PO TABS
2.0000 | ORAL_TABLET | Freq: Every day | ORAL | Status: DC
  Administered 2012-01-03 – 2012-01-04 (×2): 2 via ORAL

## 2012-01-03 MED ORDER — ATROPINE SULFATE 0.4 MG/ML IJ SOLN
INTRAMUSCULAR | Status: DC | PRN
  Administered 2012-01-03: .1 mg via INTRAVENOUS

## 2012-01-03 MED ORDER — METOCLOPRAMIDE HCL 5 MG/ML IJ SOLN: 10.0000 mg | Freq: Three times a day (TID) | INTRAMUSCULAR | Status: DC | PRN

## 2012-01-03 MED ORDER — ONDANSETRON HCL 4 MG/2ML IJ SOLN: 4.0000 mg | INTRAMUSCULAR | Status: DC | PRN

## 2012-01-03 MED ORDER — SUCCINYLCHOLINE CHLORIDE 20 MG/ML IJ SOLN
INTRAMUSCULAR | Status: AC
  Filled 2012-01-03: qty 10

## 2012-01-03 MED ORDER — PROMETHAZINE HCL 25 MG/ML IJ SOLN: 6.2500 mg | INTRAMUSCULAR | Status: DC | PRN

## 2012-01-03 MED ORDER — CEFAZOLIN SODIUM-DEXTROSE 2-3 GM-% IV SOLR
INTRAVENOUS | Status: AC
  Filled 2012-01-03: qty 50

## 2012-01-03 MED ORDER — EPHEDRINE SULFATE 50 MG/ML IJ SOLN
INTRAMUSCULAR | Status: DC | PRN
  Administered 2012-01-03 (×3): 10 mg via INTRAVENOUS

## 2012-01-03 MED ORDER — WITCH HAZEL-GLYCERIN EX PADS: 1.0000 "application " | MEDICATED_PAD | CUTANEOUS | Status: DC | PRN

## 2012-01-03 MED ORDER — MEDROXYPROGESTERONE ACETATE 150 MG/ML IM SUSP: 150.0000 mg | INTRAMUSCULAR | Status: DC | PRN

## 2012-01-03 MED ORDER — KETOROLAC TROMETHAMINE 30 MG/ML IJ SOLN: 30.0000 mg | Freq: Once | INTRAMUSCULAR | Status: AC

## 2012-01-03 MED ORDER — OXYCODONE-ACETAMINOPHEN 5-325 MG PO TABS
1.0000 | ORAL_TABLET | ORAL | Status: DC | PRN
  Administered 2012-01-04: 2 via ORAL
  Administered 2012-01-04 (×3): 1 via ORAL
  Administered 2012-01-05 (×2): 2 via ORAL
  Administered 2012-01-05: 1 via ORAL
  Filled 2012-01-03 (×2): qty 2
  Filled 2012-01-03 (×2): qty 1
  Filled 2012-01-03: qty 2
  Filled 2012-01-03 (×2): qty 1

## 2012-01-03 MED ORDER — MORPHINE SULFATE (PF) 0.5 MG/ML IJ SOLN
INTRAMUSCULAR | Status: DC | PRN
  Administered 2012-01-03: 100 ug via INTRATHECAL

## 2012-01-03 MED ORDER — SODIUM CHLORIDE 0.9 % IJ SOLN: 3.0000 mL | INTRAMUSCULAR | Status: DC | PRN

## 2012-01-03 MED ORDER — MENTHOL 3 MG MT LOZG: 1.0000 | LOZENGE | OROMUCOSAL | Status: DC | PRN

## 2012-01-03 MED ORDER — SODIUM CHLORIDE 0.9 % IV SOLN
1.0000 ug/kg/h | INTRAVENOUS | Status: DC | PRN
  Filled 2012-01-03: qty 2.5

## 2012-01-03 MED ORDER — ONDANSETRON HCL 4 MG/2ML IJ SOLN
INTRAMUSCULAR | Status: DC | PRN
  Administered 2012-01-03: 4 mg via INTRAVENOUS

## 2012-01-03 MED ORDER — BUPIVACAINE IN DEXTROSE 0.75-8.25 % IT SOLN
INTRATHECAL | Status: DC | PRN
  Administered 2012-01-03: 12 mg via INTRATHECAL

## 2012-01-03 MED ORDER — LANOLIN HYDROUS EX OINT: 1.0000 "application " | TOPICAL_OINTMENT | CUTANEOUS | Status: DC | PRN

## 2012-01-03 MED ORDER — SCOPOLAMINE 1 MG/3DAYS TD PT72
1.0000 | MEDICATED_PATCH | Freq: Once | TRANSDERMAL | Status: DC
  Filled 2012-01-03: qty 1

## 2012-01-03 MED ORDER — MEPERIDINE HCL 25 MG/ML IJ SOLN: 6.2500 mg | INTRAMUSCULAR | Status: DC | PRN

## 2012-01-03 MED ORDER — CEFAZOLIN SODIUM-DEXTROSE 2-3 GM-% IV SOLR
2.0000 g | INTRAVENOUS | Status: AC
  Administered 2012-01-03: 2 g via INTRAVENOUS

## 2012-01-03 MED ORDER — OXYTOCIN 10 UNIT/ML IJ SOLN
INTRAMUSCULAR | Status: AC
  Filled 2012-01-03: qty 4

## 2012-01-03 MED ORDER — OXYTOCIN 40 UNITS IN LACTATED RINGERS INFUSION - SIMPLE MED: 62.5000 mL/h | INTRAVENOUS | Status: AC

## 2012-01-03 MED ORDER — IBUPROFEN 600 MG PO TABS
600.0000 mg | ORAL_TABLET | Freq: Four times a day (QID) | ORAL | Status: DC
  Administered 2012-01-04 – 2012-01-05 (×6): 600 mg via ORAL
  Filled 2012-01-03 (×6): qty 1

## 2012-01-03 MED ORDER — OXYTOCIN 10 UNIT/ML IJ SOLN
40.0000 [IU] | INTRAVENOUS | Status: DC | PRN
  Administered 2012-01-03: 40 [IU] via INTRAVENOUS

## 2012-01-03 MED ORDER — KETOROLAC TROMETHAMINE 30 MG/ML IJ SOLN: 30.0000 mg | Freq: Four times a day (QID) | INTRAMUSCULAR | Status: AC | PRN

## 2012-01-03 MED ORDER — PRENATAL MULTIVITAMIN CH
1.0000 | ORAL_TABLET | Freq: Every day | ORAL | Status: DC
  Administered 2012-01-04 – 2012-01-05 (×2): 1 via ORAL
  Filled 2012-01-03 (×2): qty 1

## 2012-01-03 MED ORDER — SIMETHICONE 80 MG PO CHEW: 80.0000 mg | CHEWABLE_TABLET | ORAL | Status: DC | PRN

## 2012-01-03 MED ORDER — FENTANYL CITRATE 0.05 MG/ML IJ SOLN
INTRAMUSCULAR | Status: DC | PRN
  Administered 2012-01-03: 12.5 ug via INTRAVENOUS

## 2012-01-03 MED ORDER — SIMETHICONE 80 MG PO CHEW
80.0000 mg | CHEWABLE_TABLET | Freq: Three times a day (TID) | ORAL | Status: DC
  Administered 2012-01-03 – 2012-01-05 (×5): 80 mg via ORAL

## 2012-01-03 MED ORDER — ATROPINE SULFATE 0.4 MG/ML IJ SOLN
INTRAMUSCULAR | Status: AC
  Filled 2012-01-03: qty 1

## 2012-01-03 MED ORDER — DIPHENHYDRAMINE HCL 50 MG/ML IJ SOLN: 12.5000 mg | INTRAMUSCULAR | Status: DC | PRN

## 2012-01-03 MED ORDER — EPHEDRINE 5 MG/ML INJ
INTRAVENOUS | Status: AC
  Filled 2012-01-03: qty 10

## 2012-01-03 MED ORDER — KETOROLAC TROMETHAMINE 30 MG/ML IJ SOLN
30.0000 mg | Freq: Four times a day (QID) | INTRAMUSCULAR | Status: AC | PRN
  Administered 2012-01-03 (×2): 30 mg via INTRAMUSCULAR
  Filled 2012-01-03: qty 1

## 2012-01-03 MED ORDER — ACETAMINOPHEN 10 MG/ML IV SOLN
1000.0000 mg | Freq: Four times a day (QID) | INTRAVENOUS | Status: AC | PRN
  Filled 2012-01-03: qty 100

## 2012-01-03 MED ORDER — FENTANYL CITRATE 0.05 MG/ML IJ SOLN: 25.0000 ug | INTRAMUSCULAR | Status: DC | PRN

## 2012-01-03 MED ORDER — FENTANYL CITRATE 0.05 MG/ML IJ SOLN
INTRAMUSCULAR | Status: AC
  Filled 2012-01-03: qty 2

## 2012-01-03 MED ORDER — ONDANSETRON HCL 4 MG PO TABS: 4.0000 mg | ORAL_TABLET | ORAL | Status: DC | PRN

## 2012-01-03 MED ORDER — TETANUS-DIPHTH-ACELL PERTUSSIS 5-2.5-18.5 LF-MCG/0.5 IM SUSP
0.5000 mL | Freq: Once | INTRAMUSCULAR | Status: AC
  Administered 2012-01-04: 0.5 mL via INTRAMUSCULAR
  Filled 2012-01-03: qty 0.5

## 2012-01-03 MED ORDER — DIPHENHYDRAMINE HCL 25 MG PO CAPS
25.0000 mg | ORAL_CAPSULE | ORAL | Status: DC | PRN
Start: 2012-01-03 — End: 2012-01-05

## 2012-01-03 MED ORDER — SCOPOLAMINE 1 MG/3DAYS TD PT72
1.0000 | MEDICATED_PATCH | Freq: Once | TRANSDERMAL | Status: DC
  Administered 2012-01-03: 1.5 mg via TRANSDERMAL

## 2012-01-03 MED ORDER — KETOROLAC TROMETHAMINE 30 MG/ML IJ SOLN
INTRAMUSCULAR | Status: AC
  Administered 2012-01-03: 30 mg via INTRAMUSCULAR
  Filled 2012-01-03: qty 1

## 2012-01-03 MED ORDER — MIDAZOLAM HCL 2 MG/2ML IJ SOLN: 0.5000 mg | Freq: Once | INTRAMUSCULAR | Status: DC | PRN

## 2012-01-03 MED ORDER — DEXTROSE 5 % IV SOLN
2.0000 g | Freq: Four times a day (QID) | INTRAVENOUS | Status: DC
  Administered 2012-01-03 – 2012-01-04 (×3): 2 g via INTRAVENOUS
  Filled 2012-01-03 (×4): qty 2

## 2012-01-03 MED ORDER — DIPHENHYDRAMINE HCL 50 MG/ML IJ SOLN: 25.0000 mg | INTRAMUSCULAR | Status: DC | PRN

## 2012-01-03 MED ORDER — LACTATED RINGERS IV SOLN
INTRAVENOUS | Status: DC
  Administered 2012-01-03: 22:00:00 via INTRAVENOUS

## 2012-01-03 SURGICAL SUPPLY — 42 items
ADH SKN CLS APL DERMABOND .7 (GAUZE/BANDAGES/DRESSINGS) ×1
CANISTER WOUND CARE 500ML ATS (WOUND CARE) IMPLANT
CLOTH BEACON ORANGE TIMEOUT ST (SAFETY) ×2 IMPLANT
CONTAINER PREFILL 10% NBF 15ML (MISCELLANEOUS) ×4 IMPLANT
DERMABOND ADVANCED (GAUZE/BANDAGES/DRESSINGS) ×1
DERMABOND ADVANCED .7 DNX12 (GAUZE/BANDAGES/DRESSINGS) ×1 IMPLANT
DRAPE SURG 17X23 STRL (DRAPES) ×2 IMPLANT
DRSG COVADERM 4X10 (GAUZE/BANDAGES/DRESSINGS) IMPLANT
DRSG VAC ATS LRG SENSATRAC (GAUZE/BANDAGES/DRESSINGS) IMPLANT
DRSG VAC ATS MED SENSATRAC (GAUZE/BANDAGES/DRESSINGS) IMPLANT
DRSG VAC ATS SM SENSATRAC (GAUZE/BANDAGES/DRESSINGS) IMPLANT
DURAPREP 26ML APPLICATOR (WOUND CARE) ×2 IMPLANT
ELECT REM PT RETURN 9FT ADLT (ELECTROSURGICAL) ×2
ELECTRODE REM PT RTRN 9FT ADLT (ELECTROSURGICAL) ×1 IMPLANT
EXTRACTOR VACUUM M CUP 4 TUBE (SUCTIONS) IMPLANT
GLOVE BIO SURGEON STRL SZ8 (GLOVE) ×4 IMPLANT
GOWN PREVENTION PLUS LG XLONG (DISPOSABLE) ×4 IMPLANT
GOWN PREVENTION PLUS XLARGE (GOWN DISPOSABLE) ×2 IMPLANT
KIT ABG SYR 3ML LUER SLIP (SYRINGE) IMPLANT
NEEDLE HYPO 25X5/8 SAFETYGLIDE (NEEDLE) ×2 IMPLANT
NS IRRIG 1000ML POUR BTL (IV SOLUTION) ×2 IMPLANT
PACK C SECTION WH (CUSTOM PROCEDURE TRAY) ×2 IMPLANT
PAD OB MATERNITY 4.3X12.25 (PERSONAL CARE ITEMS) IMPLANT
RTRCTR C-SECT PINK 25CM LRG (MISCELLANEOUS) ×2 IMPLANT
SLEEVE SCD COMPRESS KNEE MED (MISCELLANEOUS) IMPLANT
STAPLER VISISTAT 35W (STAPLE) IMPLANT
SUT GUT PLAIN 0 CT-3 TAN 27 (SUTURE) IMPLANT
SUT MNCRL 0 VIOLET CTX 36 (SUTURE) ×3 IMPLANT
SUT MNCRL AB 4-0 PS2 18 (SUTURE) IMPLANT
SUT MON AB 2-0 CT1 27 (SUTURE) ×2 IMPLANT
SUT MON AB 3-0 SH 27 (SUTURE)
SUT MON AB 3-0 SH27 (SUTURE) IMPLANT
SUT MONOCRYL 0 CTX 36 (SUTURE) ×3
SUT PDS AB 0 CTX 60 (SUTURE) IMPLANT
SUT PLAIN 2 0 XLH (SUTURE) IMPLANT
SUT VIC AB 0 CTX 36 (SUTURE)
SUT VIC AB 0 CTX36XBRD ANBCTRL (SUTURE) IMPLANT
SUT VIC AB 2-0 CT1 27 (SUTURE)
SUT VIC AB 2-0 CT1 TAPERPNT 27 (SUTURE) IMPLANT
TOWEL OR 17X24 6PK STRL BLUE (TOWEL DISPOSABLE) ×4 IMPLANT
TRAY FOLEY CATH 14FR (SET/KITS/TRAYS/PACK) ×2 IMPLANT
WATER STERILE IRR 1000ML POUR (IV SOLUTION) ×2 IMPLANT

## 2012-01-03 NOTE — Transfer of Care (Signed)
Immediate Anesthesia Transfer of Care Note  Patient: [name]  Procedure(s) Performed: Procedure(s) (LRB) with comments: CESAREAN SECTION (N/A) - Prev c/s 01/10/12  Patient Location: PACU  Anesthesia Type:Spinal  Level of Consciousness: awake, alert , oriented and patient cooperative  Airway & Oxygen Therapy: Patient Spontanous Breathing and Patient connected to nasal cannula oxygen  Post-op Assessment: Report given to PACU RN and Post -op Vital signs reviewed and stable  Post vital signs: Reviewed and stable  Complications: No apparent anesthesia complications

## 2012-01-03 NOTE — H&P (Signed)
  [name] is a 26 y.o. female presenting for repeat C/S. Maternal Medical History:  Fetal activity: Perceived fetal activity is normal.   Last perceived fetal movement was within the past hour.    Prenatal complications: no prenatal complications Prenatal Complications - Diabetes: none.    OB History    Grav Para Term Preterm Abortions TAB SAB Ect Mult Living   2 1 1  0 0 0 0 0 0 1     Past Medical History  Diagnosis Date  . PCOS (polycystic ovarian syndrome)   . PONV (postoperative nausea and vomiting)    Past Surgical History  Procedure Date  . Cesarean section 2009  . Tonsillectomy    Family History: family history is not on file. Social History:  reports that she quit smoking about 2 years ago. She does not have any smokeless tobacco history on file. She reports that she does not drink alcohol or use illicit drugs.   Prenatal Transfer Tool  Maternal Diabetes: No Genetic Screening: Normal Maternal Ultrasounds/Referrals: Normal Fetal Ultrasounds or other Referrals:  None Maternal Substance Abuse:  No Significant Maternal Medications:  Meds include: Other:  Significant Maternal Lab Results:  Lab values include: Other:  Other Comments:  None  Review of Systems  All other systems reviewed and are negative.      Last menstrual period 03/25/2011. Maternal Exam:  Abdomen: Patient reports no abdominal tenderness. Surgical scars: low transverse.   Introitus: not evaluated.   Cervix: not evaluated.   Physical Exam  Nursing note and vitals reviewed. Constitutional: She is oriented to person, place, and time. She appears well-developed and well-nourished.  HENT:  Head: Normocephalic and atraumatic.  Eyes: Conjunctivae normal are normal. Pupils are equal, round, and reactive to light.  Neck: Normal range of motion. Neck supple.  Cardiovascular: Normal rate and regular rhythm.   Respiratory: Effort normal.  GI: Soft.  Musculoskeletal: Normal range of motion.   Neurological: She is alert and oriented to person, place, and time.  Skin: Skin is warm and dry.  Psychiatric: She has a normal mood and affect. Her behavior is normal. Judgment and thought content normal.    Prenatal labs: ABO, Rh: --/--/O POS (11/12 1415) Antibody: NEG (11/12 1415) Rubella: Immune (04/23 0000) RPR: NON REACTIVE (11/12 1416)  HBsAg: Negative (04/23 0000)  HIV: Non-reactive (04/23 0000)  GBS:     Assessment/Plan: 39.2 weeks.  Previous C/S.  Desires repeat C/S.   HARPER,CHARLES A 01/03/2012, 10:56 AM

## 2012-01-03 NOTE — Anesthesia Preprocedure Evaluation (Signed)
Anesthesia Evaluation  Patient identified by MRN, date of birth, ID band Patient awake    Reviewed: Allergy & Precautions, H&P , NPO status , Patient's Chart, lab work & pertinent test results  History of Anesthesia Complications (+) PONV  Airway Mallampati: II      Dental No notable dental hx.    Pulmonary neg pulmonary ROS,  breath sounds clear to auscultation  Pulmonary exam normal       Cardiovascular Exercise Tolerance: Good negative cardio ROS  Rhythm:regular Rate:Normal     Neuro/Psych negative neurological ROS  negative psych ROS   GI/Hepatic negative GI ROS, Neg liver ROS,   Endo/Other  negative endocrine ROS  Renal/GU negative Renal ROS  negative genitourinary   Musculoskeletal   Abdominal Normal abdominal exam  (+)   Peds  Hematology negative hematology ROS (+)   Anesthesia Other Findings   Reproductive/Obstetrics (+) Pregnancy                           Anesthesia Physical Anesthesia Plan  ASA: II  Anesthesia Plan: Spinal   Post-op Pain Management:    Induction:   Airway Management Planned:   Additional Equipment:   Intra-op Plan:   Post-operative Plan:   Informed Consent: I have reviewed the patients History and Physical, chart, labs and discussed the procedure including the risks, benefits and alternatives for the proposed anesthesia with the patient or authorized representative who has indicated his/her understanding and acceptance.     Plan Discussed with: Anesthesiologist, CRNA and Surgeon  Anesthesia Plan Comments:         Anesthesia Quick Evaluation  

## 2012-01-03 NOTE — Anesthesia Postprocedure Evaluation (Signed)
  Anesthesia Post-op Note  Patient: [name]  Procedure(s) Performed: Procedure(s) (LRB) with comments: CESAREAN SECTION (N/A) - Prev c/s 01/10/12  Patient Location: Mother/Baby  Anesthesia Type:Spinal  Level of Consciousness: awake  Airway and Oxygen Therapy: Patient Spontanous Breathing  Post-op Pain: mild  Post-op Assessment: Patient's Cardiovascular Status Stable and Respiratory Function Stable  Post-op Vital Signs: stable  Complications: No apparent anesthesia complications

## 2012-01-03 NOTE — Anesthesia Procedure Notes (Signed)
Spinal  Patient location during procedure: OR Start time: 01/03/2012 2:00 PM End time: 01/03/2012 2:07 PM Staffing Anesthesiologist: Sandrea Hughs Performed by: anesthesiologist  Preanesthetic Checklist Completed: patient identified, site marked, surgical consent, pre-op evaluation, timeout performed, IV checked, risks and benefits discussed and monitors and equipment checked Spinal Block Patient position: sitting Prep: DuraPrep Patient monitoring: heart rate, cardiac monitor, continuous pulse ox and blood pressure Approach: midline Location: L3-4 Injection technique: single-shot Needle Needle type: Sprotte  Needle gauge: 24 G Needle length: 9 cm Needle insertion depth: 8 cm Assessment Sensory level: T4

## 2012-01-03 NOTE — Op Note (Signed)
Cesarean Section Procedure Note   [name]   01/03/2012  Indications: Scheduled Proceedure/Maternal Request   Pre-operative Diagnosis: prev c/s.   Post-operative Diagnosis: Same   Surgeon: Coral Ceo A  Assistants: Antionette Char MD   Anesthesia: spinal  Procedure Details:  The patient was seen in the Holding Room. The risks, benefits, complications, treatment options, and expected outcomes were discussed with the patient. The patient concurred with the proposed plan, giving informed consent. The patient was identified as [name] and the procedure verified as C-Section Delivery. A Time Out was held and the above information confirmed.  After induction of anesthesia, the patient was draped and prepped in the usual sterile manner. A transverse incision was made and carried down through the subcutaneous tissue to the fascia. The fascial incision was made and extended transversely. The fascia was separated from the underlying rectus tissue superiorly and inferiorly. The peritoneum was identified and entered. The peritoneal incision was extended longitudinally. The utero-vesical peritoneal reflection was incised transversely and the bladder flap was bluntly freed from the lower uterine segment. A low transverse uterine incision was made. Delivered from cephalic presentation was a 3485 gram living newborn female infant(s). APGAR (1 MIN): 9   APGAR (5 MINS): 9   APGAR (10 MINS):    A cord ph was not sent. The umbilical cord was clamped and cut cord. A sample was obtained for evaluation. The placenta was removed Intact and appeared normal.  The uterine incision was closed with running locked sutures of 1-0 Monocryl. A second imbricating layer of the same suture was placed.  Hemostasis was observed. The paracolic gutters were irrigated. The parieto peritoneum was closed in a running fashion with 2-0 Vicryl.  The fascia was then reapproximated with running sutures of 0 Vicryl.   The skin was closed with staples.  Instrument, sponge, and needle counts were correct prior the abdominal closure and were correct at the conclusion of the case.    Findings:   Estimated Blood Loss:  Total IV Fluids:   Urine Output: 150CC OF clear urine  Specimens: None  Complications: no complications  Disposition: PACU - hemodynamically stable.  Maternal Condition: stable   Baby condition / location:  nursery-stable    Signed: Surgeon(s): Brock Bad, MD

## 2012-01-03 NOTE — Anesthesia Postprocedure Evaluation (Signed)
Anesthesia Post Note  Patient: [name]  Procedure(s) Performed: Procedure(s) (LRB): CESAREAN SECTION (N/A)  Anesthesia type: Spinal  Patient location: PACU  Post pain: Pain level controlled  Post assessment: Post-op Vital signs reviewed  Last Vitals:  Filed Vitals:   01/03/12 1520  BP: 95/48  Pulse: 77  Temp: 36.5 C  Resp: 22    Post vital signs: Reviewed  Level of consciousness: awake  Complications: No apparent anesthesia complications

## 2012-01-03 NOTE — Addendum Note (Signed)
Addendum  created 01/03/12 1840 by Renford Dills, CRNA   Modules edited:Notes Section

## 2012-01-03 NOTE — Preoperative (Signed)
Beta Blockers   Reason not to administer Beta Blockers:Not Applicable 

## 2012-01-04 ENCOUNTER — Encounter (HOSPITAL_COMMUNITY): Payer: Self-pay | Admitting: Obstetrics

## 2012-01-04 LAB — CBC
Hemoglobin: 11.4 g/dL — ABNORMAL LOW (ref 12.0–15.0)
MCH: 30.9 pg (ref 26.0–34.0)
MCV: 92.4 fL (ref 78.0–100.0)
Platelets: 222 10*3/uL (ref 150–400)
RBC: 3.69 MIL/uL — ABNORMAL LOW (ref 3.87–5.11)

## 2012-01-04 NOTE — Progress Notes (Signed)
Subjective: Postpartum Day 1: Cesarean Delivery Patient reports incisional pain and tolerating PO.    Objective: Vital signs in last 24 hours: Temp:  [97.6 F (36.4 C)-98.8 F (37.1 C)] 98.8 F (37.1 C) (11/15 0530) Pulse Rate:  [62-86] 83  (11/15 0530) Resp:  [16-22] 18  (11/15 0530) BP: (95-130)/(48-89) 111/54 mmHg (11/15 0530) SpO2:  [97 %-100 %] 98 % (11/15 0530) Weight:  [108.863 kg (240 lb)] 108.863 kg (240 lb) (11/14 1655)  Physical Exam:  General: alert and no distress Lochia: appropriate Uterine Fundus: firm Incision: healing well DVT Evaluation: No evidence of DVT seen on physical exam.   Basename 01/01/12 1416  HGB 13.2  HCT 39.0    Assessment/Plan: Status post Cesarean section. Doing well postoperatively.  Continue current care.  Craig Ionescu A 01/04/2012, 6:06 AM

## 2012-01-05 LAB — TYPE AND SCREEN: Antibody Screen: NEGATIVE

## 2012-01-05 MED ORDER — IBUPROFEN 600 MG PO TABS: 600.0000 mg | ORAL_TABLET | Freq: Four times a day (QID) | ORAL | Status: DC | PRN

## 2012-01-05 MED ORDER — OXYCODONE-ACETAMINOPHEN 5-325 MG PO TABS: 1.0000 | ORAL_TABLET | ORAL | Status: DC | PRN

## 2012-01-05 NOTE — Progress Notes (Signed)
Subjective: Postpartum Day 2: Cesarean Delivery Patient reports incisional pain, tolerating PO, + flatus and no problems voiding.    Objective: Vital signs in last 24 hours: Temp:  [98.3 F (36.8 C)-98.9 F (37.2 C)] 98.3 F (36.8 C) (11/16 0557) Pulse Rate:  [69-97] 80  (11/16 0557) Resp:  [17-20] 18  (11/16 0557) BP: (95-113)/(62-72) 95/64 mmHg (11/16 0557) SpO2:  [98 %] 98 % (11/15 1300)  Physical Exam:  General: alert and no distress Lochia: appropriate Uterine Fundus: firm Incision: healing well DVT Evaluation: No evidence of DVT seen on physical exam.   Basename 01/04/12 0525  HGB 11.4*  HCT 34.1*    Assessment/Plan: Status post Cesarean section. Doing well postoperatively.  Continue current care.  [name] A 01/05/2012, 8:48 AM

## 2012-01-05 NOTE — Discharge Summary (Signed)
Obstetric Discharge Summary Reason for Admission: cesarean section Prenatal Procedures: ultrasound Intrapartum Procedures: cesarean: low cervical, transverse Postpartum Procedures: none Complications-Operative and Postpartum: none Hemoglobin  Date Value Range Status  01/04/2012 11.4* 12.0 - 15.0 g/dL Final     HCT  Date Value Range Status  01/04/2012 34.1* 36.0 - 46.0 % Final    Physical Exam:  General: alert and no distress Lochia: appropriate Uterine Fundus: firm Incision: healing well DVT Evaluation: No evidence of DVT seen on physical exam.  Discharge Diagnoses: Term Pregnancy-delivered  Discharge Information: Date: 01/05/2012 Activity: pelvic rest Diet: routine Medications: PNV, Ibuprofen, Colace and Percocet Condition: stable Instructions: refer to practice specific booklet Discharge to: home Follow-up Information    Follow up with HARPER,CHARLES A, MD. Schedule an appointment as soon as possible for a visit in 2 days. (Removal of staples)    Contact information:   [address] [place] [address] [phone]          Newborn Data: Live born female  Birth Weight: 7 lb 10.9 oz (3485 g) APGAR: 9, 9  Home with mother.  HARPER,CHARLES A 01/05/2012, 9:53 AM

## 2012-01-20 DEATH — deceased

## 2012-06-29 IMAGING — US US OB COMP LESS 14 WK
1 series · 13 of 23 positions shown · non-contrast
Comparison: Prior ultrasound of pregnancy performed 05/26/2007

CLINICAL DATA: Vaginal bleeding.

OBSTETRIC <14 WK US AND TRANSVAGINAL OB US
TECHNIQUE: Both transabdominal and transvaginal ultrasound
examinations were performed for complete evaluation of the
gestation as well as the maternal uterus, adnexal regions, and
pelvic cul-de-sac.  Transvaginal technique was performed to assess
early pregnancy.

[Series 1: us ob comp less 14 wks · 13 of 23 slices shown]
[im 1/23]
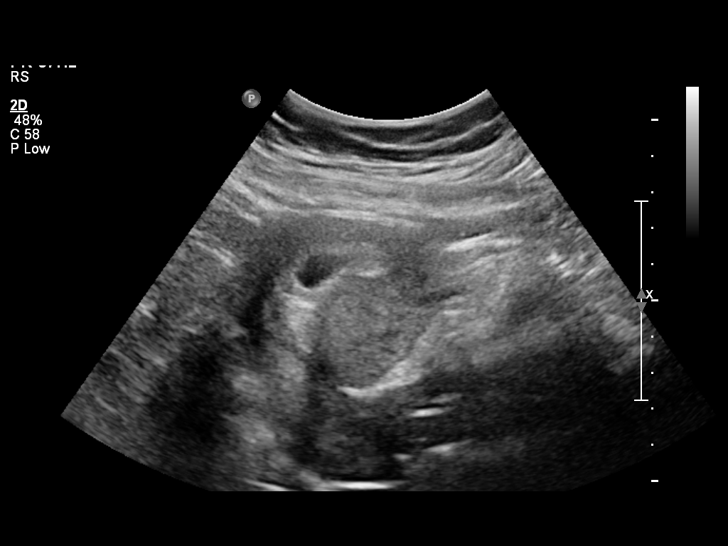
[im 3/23]
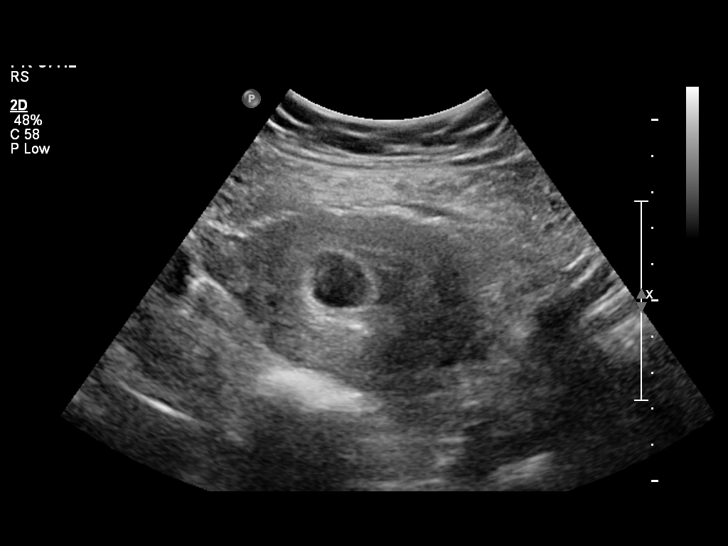
[im 5/23]
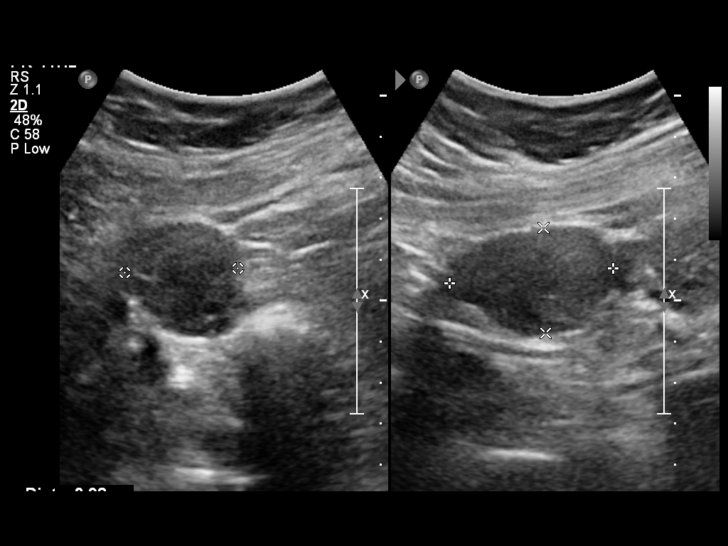
[im 7/23]
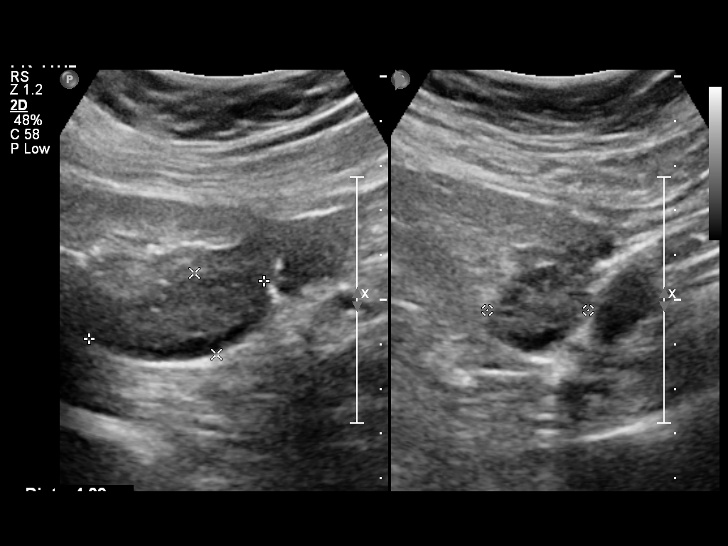
[im 8/23]
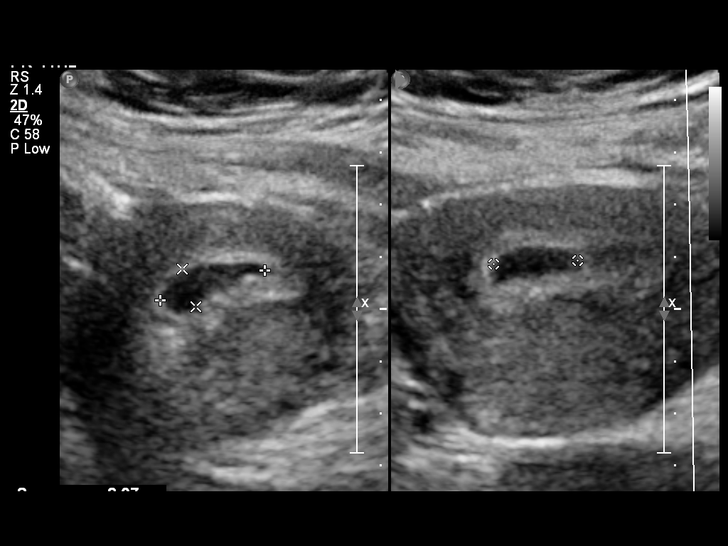
[im 10/23]
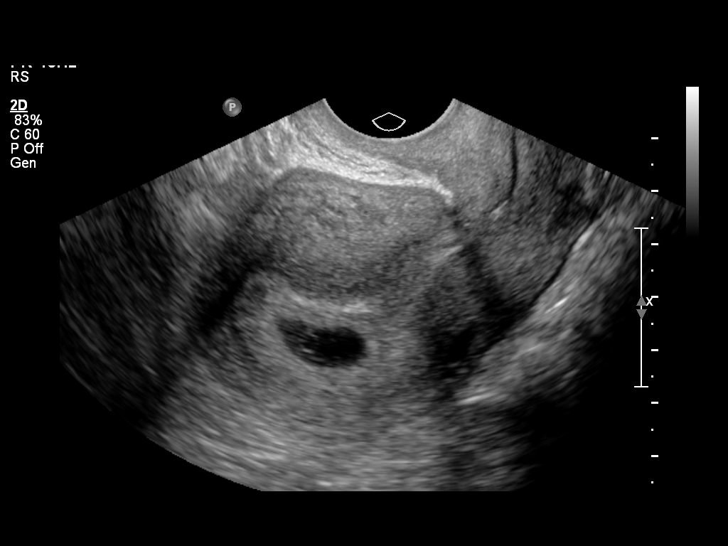
[im 12/23]
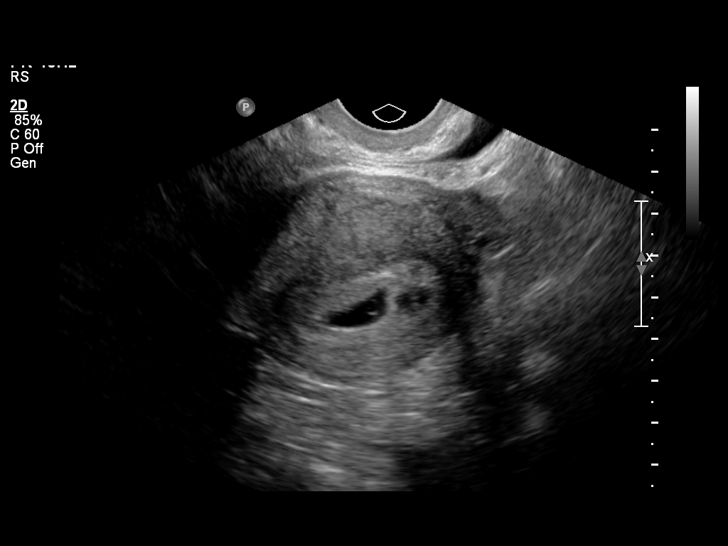
[im 14/23]
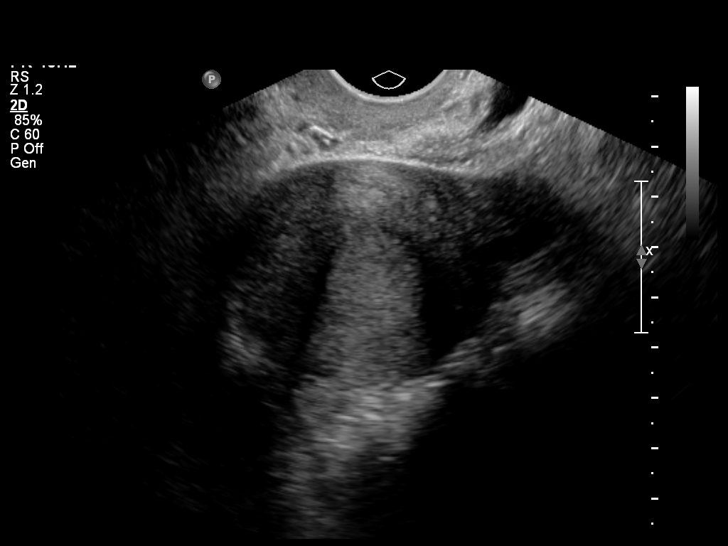
[im 16/23]
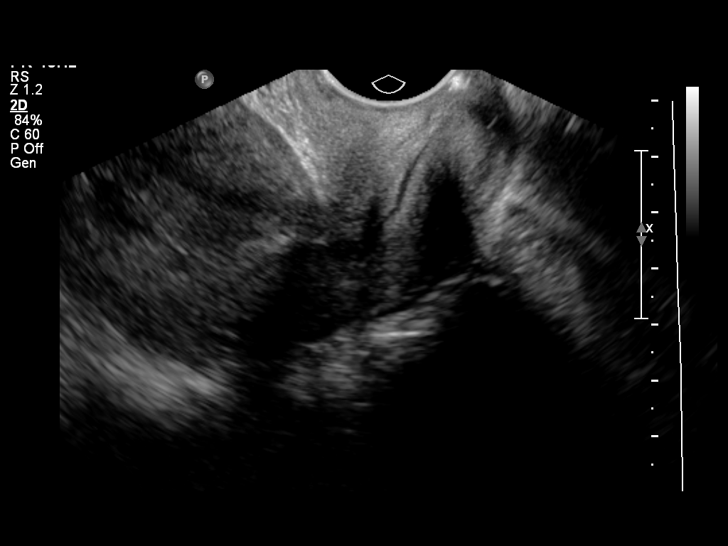
[im 17/23]
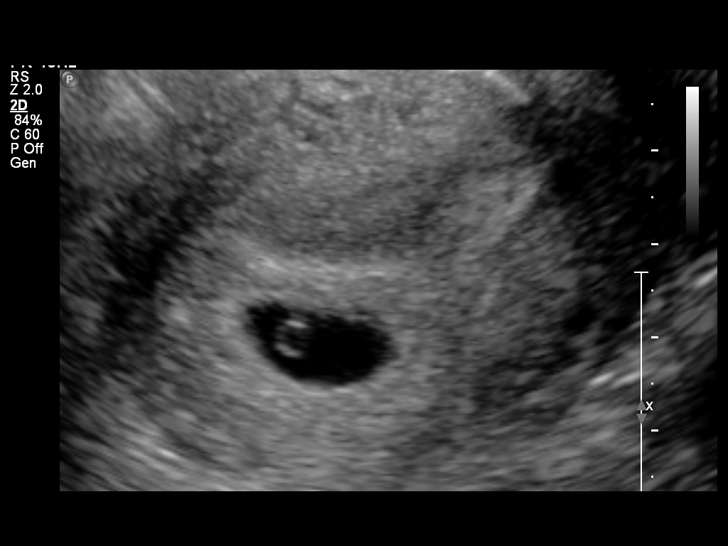
[im 19/23]
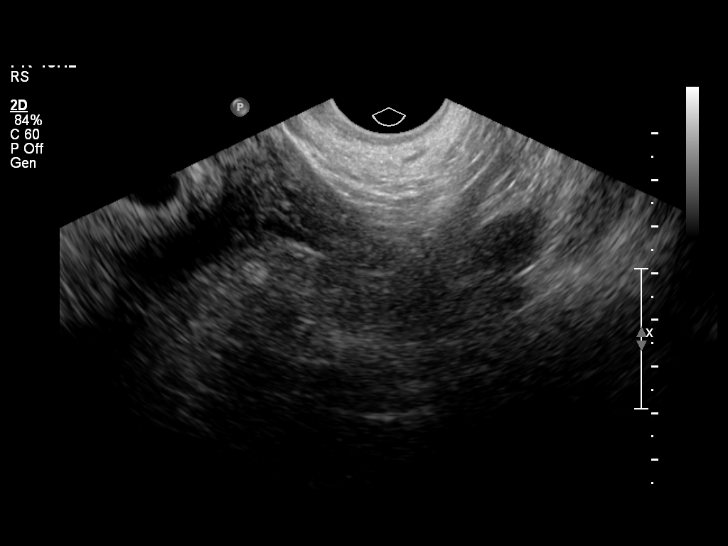
[im 21/23]
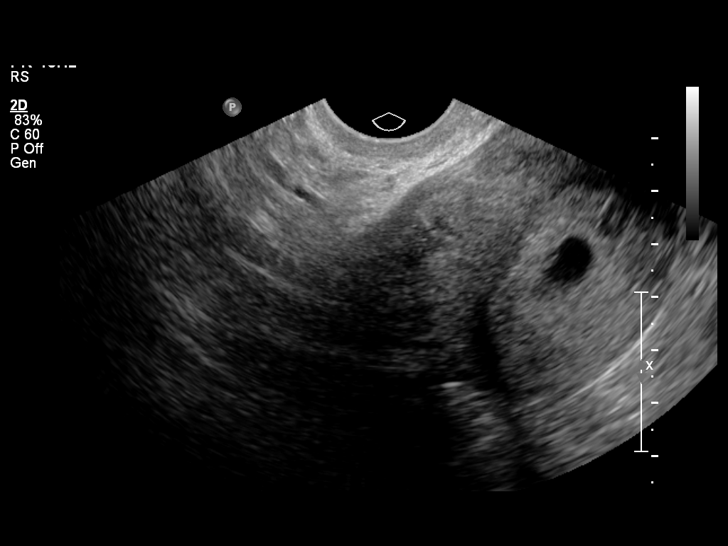
[im 23/23]
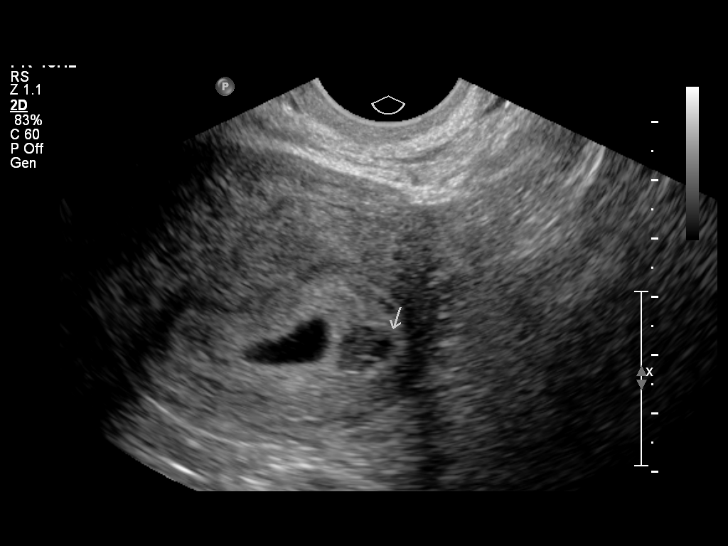

[13 of 23 positions shown; findings below may reference images not displayed]

Intrauterine gestational sac:  Visualized/normal in shape.
Yolk sac: Yes
Embryo: No
Cardiac Activity: N/A

MSD: 1.1 cm  5 w  6 d         US EDC: 01/10/2012

Maternal uterus/adnexae:
A small amount of subchorionic hemorrhage is noted.  The uterus is
otherwise unremarkable in appearance.

The ovaries are only characterized on transabdominal approach, seen
measuring 4.0 x 2.6 x 2.7 cm on the right, and 4.1 x 1.9 x 2.2 cm
on the left.  No suspicious adnexal masses are seen.  There is no
evidence for ovarian torsion.

No free fluid is seen within the pelvic cul-de-sac.
IMPRESSION: 1.  Single intrauterine gestational sac, with a mean sac diameter
of 1.1 cm, corresponding to a gestational age of 5 weeks 6 days.
This does not match the gestational age by LMP, and reflects a new
estimated date of delivery January 10, 2012.  The embryo is not
yet visualized.
2.  Small amount of subchorionic hemorrhage noted.

## 2012-07-22 IMAGING — US US OB TRANSVAGINAL
1 series · 13 of 22 positions shown · non-contrast
Comparison: none

[Series 1: us ob transvaginal · 0.12mm/px · 13 of 22 slices shown]
[im 1/22]
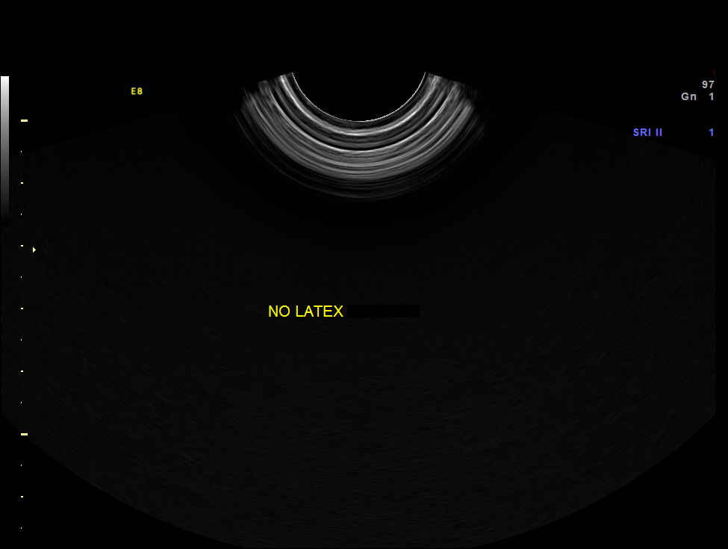
[im 3/22]
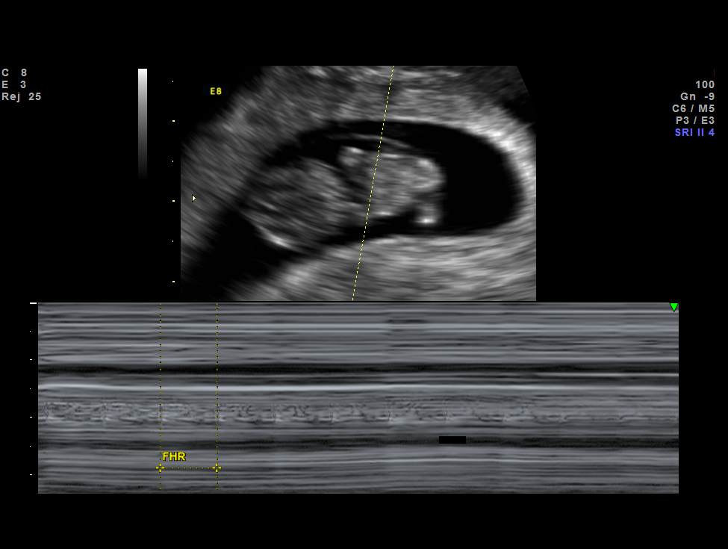
[im 5/22]
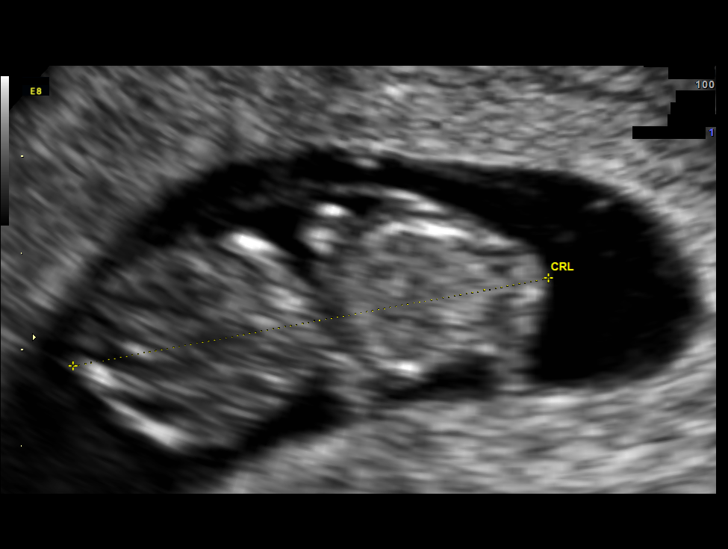
[im 6/22]
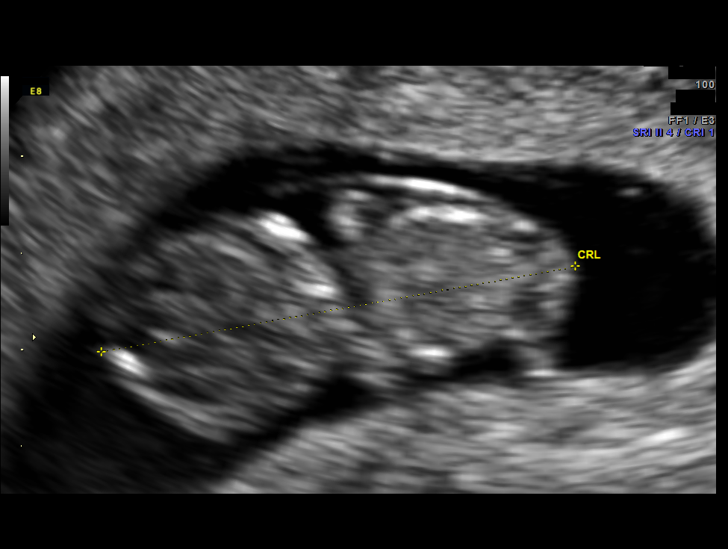
[im 8/22]
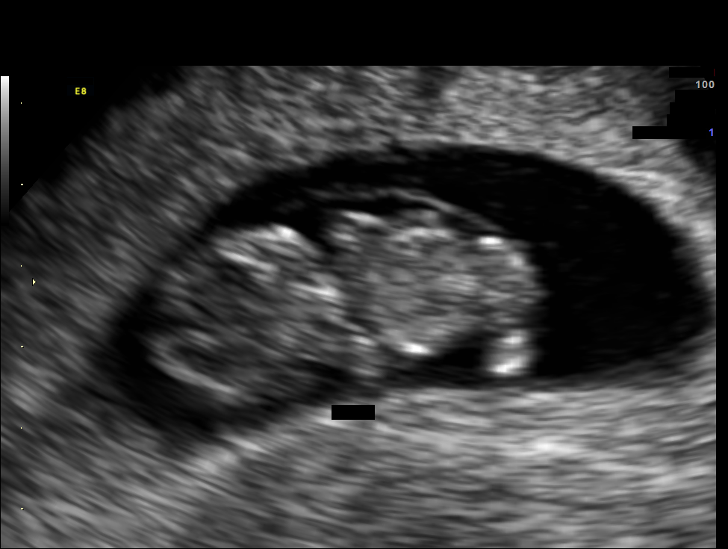
[im 10/22]
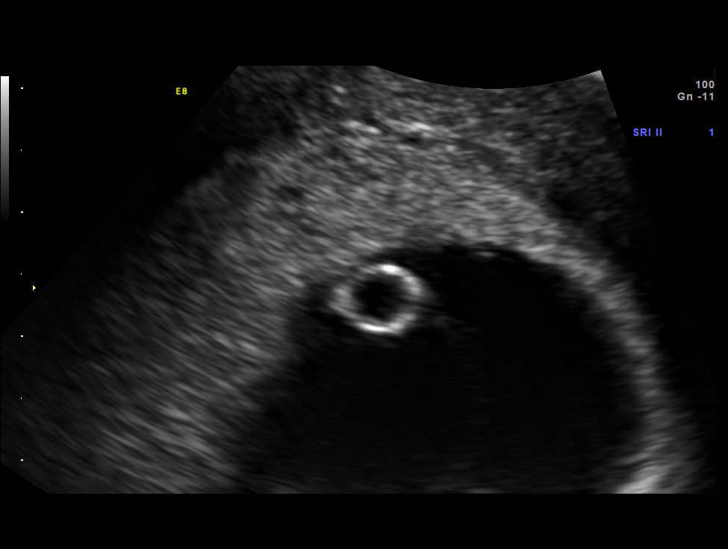
[im 12/22]
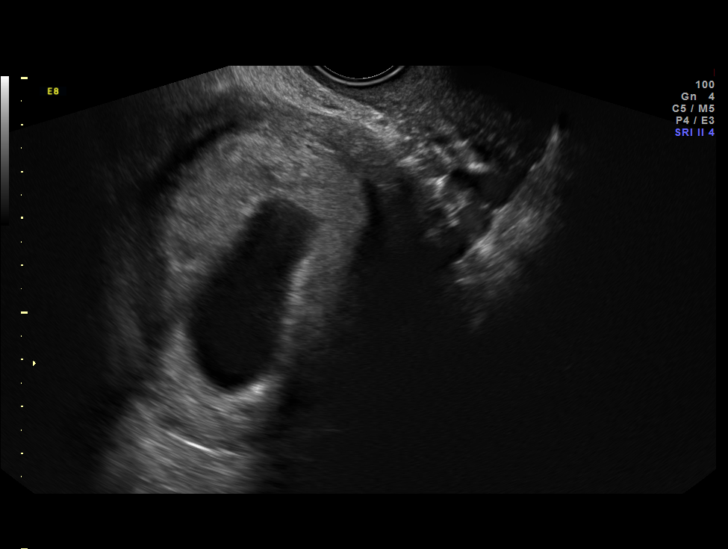
[im 13/22]
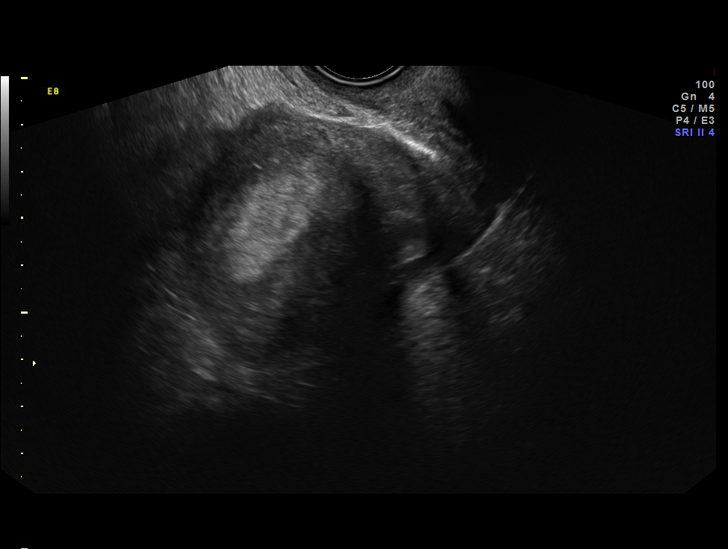
[im 15/22]
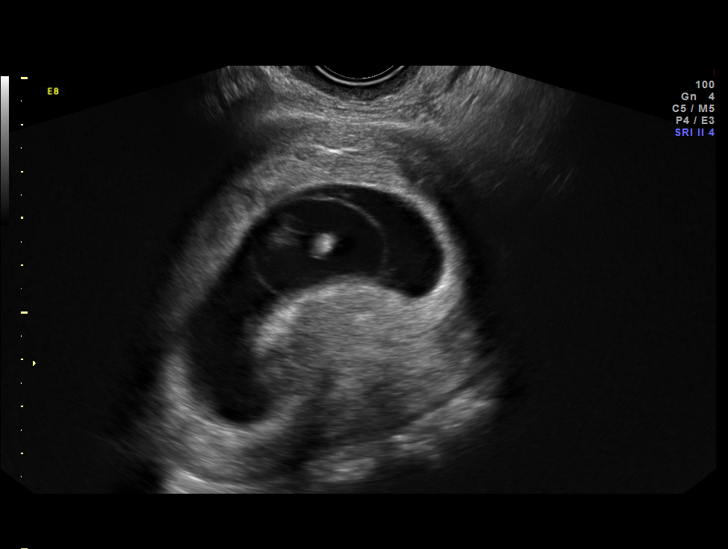
[im 17/22]
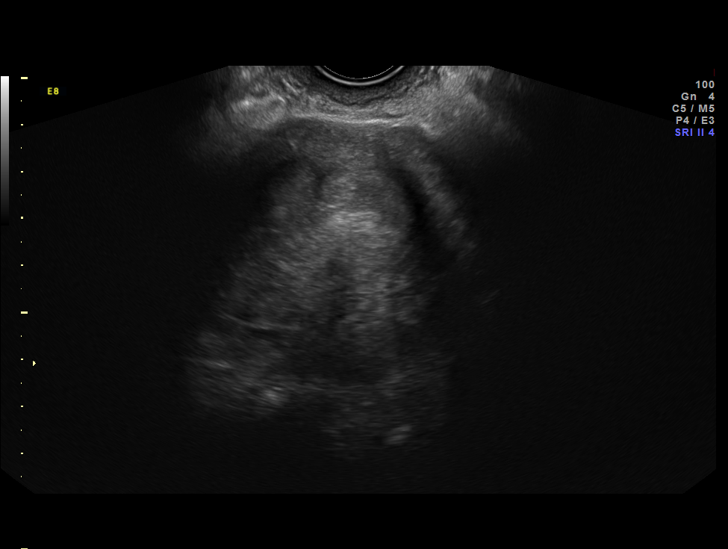
[im 18/22]
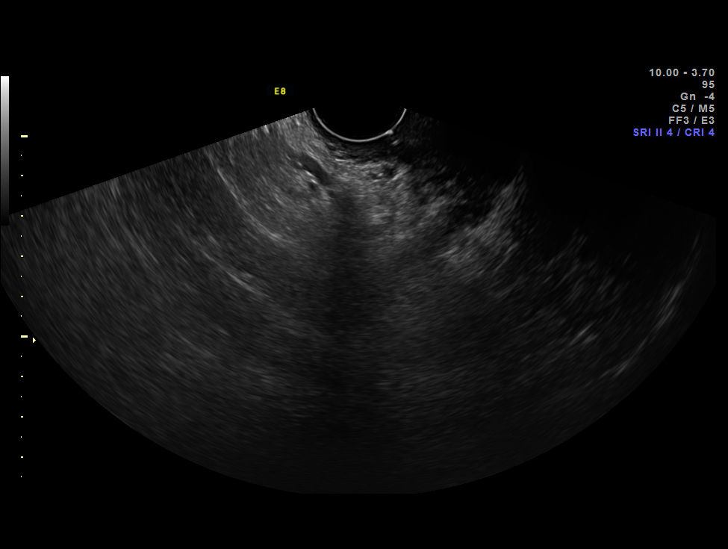
[im 20/22]
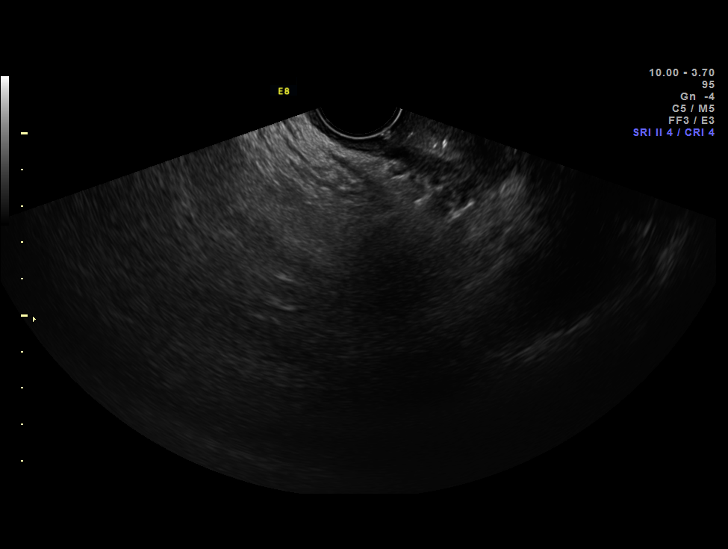
[im 22/22]
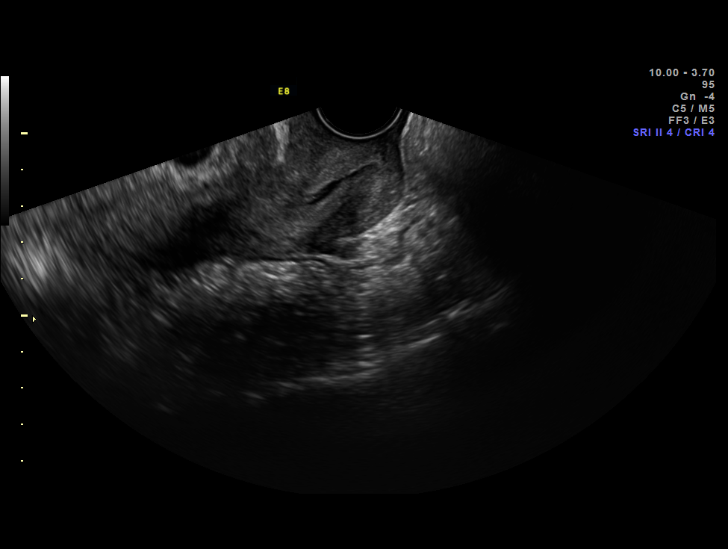

[13 of 22 positions shown; findings below may reference images not displayed]

OBSTETRICS REPORT
                      (Signed Final 06/08/2011 [DATE])

 Order#:         [id number]
Procedures

 US OB TRANSVAGINAL                                    76817.0
Indications

 Unsure of LMP;  Establish Gestational [AGE]
 Assess viability
Fetal Evaluation

 Preg. Location:    Intrauterine
 Gest. Sac:         Intrauterine
 Yolk Sac:          Visualized
 Fetal Pole:        Visualized
 Fetal Heart Rate:  178                          bpm
 Cardiac Activity:  Observed
Biometry

 CRL:     24.8  mm     G. Age:  9w 1d                  EDD:    01/10/12
Gestational Age

 LMP:           10w 5d        Date:  03/25/11                 EDD:   12/30/11
 Best:          9w 1d      Det. By:  U/S C R L (06/08/11)     EDD:   01/10/12
Cervix Uterus Adnexa

 Cervix:       Normal appearance by transvaginal scan
 Uterus:       No abnormality visualized.
 Cul De Sac:   No free fluid seen.

 Adnexa:     No abnormality visualized.
Impression

 IUP at 9+1 weeks
 Normal gestational sac, yolk sac and embryo with cardiac
 activity; no gross abnormalities identified
 Normal amniotic fluid volume
 Measurements not consistent with dates; new EDC 01/10/12
Recommendations

 Offer anatomy U/S by 18 weeks

## 2013-02-28 ENCOUNTER — Other Ambulatory Visit: Payer: Self-pay | Admitting: Obstetrics

## 2013-04-06 ENCOUNTER — Ambulatory Visit (INDEPENDENT_AMBULATORY_CARE_PROVIDER_SITE_OTHER): Payer: BC Managed Care – PPO | Admitting: Obstetrics

## 2013-04-06 ENCOUNTER — Encounter: Payer: Self-pay | Admitting: Obstetrics

## 2013-04-06 VITALS — BP 124/85 | HR 74 | Temp 98.2°F | Ht 66.0 in | Wt 203.0 lb

## 2013-04-06 DIAGNOSIS — Z3040 Encounter for surveillance of contraceptives, unspecified: Secondary | ICD-10-CM

## 2013-04-06 DIAGNOSIS — Z113 Encounter for screening for infections with a predominantly sexual mode of transmission: Secondary | ICD-10-CM

## 2013-04-06 DIAGNOSIS — Z01419 Encounter for gynecological examination (general) (routine) without abnormal findings: Secondary | ICD-10-CM

## 2013-04-06 DIAGNOSIS — E282 Polycystic ovarian syndrome: Secondary | ICD-10-CM | POA: Insufficient documentation

## 2013-04-06 MED ORDER — ETONOGESTREL-ETHINYL ESTRADIOL 0.12-0.015 MG/24HR VA RING: VAGINAL_RING | VAGINAL | Status: DC

## 2013-04-06 MED ORDER — METFORMIN HCL ER 500 MG PO TB24: ORAL_TABLET | ORAL | Status: DC

## 2013-04-06 NOTE — Progress Notes (Signed)
Subjective:     [name] is a 28 y.o. female here for a routine exam.  Current complaints: Pt would like to discuss need for birth control and use of Metformin for weight loss.  Pt was previously using Nuva Ring.  Personal health questionnaire reviewed: yes.   Gynecologic History Patient's last menstrual period was 04/03/2013. Contraception: none Last Pap: unsure. Results were: unknown Last mammogram: n/a  Obstetric History OB History  Gravida Para Term Preterm AB SAB TAB Ectopic Multiple Living  2 2 2  0 0 0 0 0 0 2    # Outcome Date GA Lbr Len/2nd Weight Sex Delivery Anes PTL Lv  2 TRM 01/03/12 6070w2d  7 lb 10.9 oz (3.485 kg) F LTCS Spinal  Y  1 TRM 2009    M CS   Y       The following portions of the patient's history were reviewed and updated as appropriate: allergies, current medications, past family history, past medical history, past social history, past surgical history and problem list.  Review of Systems Pertinent items are noted in HPI.    Objective:    General appearance: alert and no distress Breasts: normal appearance, no masses or tenderness Abdomen: normal findings: soft, non-tender Pelvic: cervix normal in appearance, external genitalia normal, no adnexal masses or tenderness, no cervical motion tenderness, rectovaginal septum normal, uterus normal size, shape, and consistency and vagina normal without discharge    Assessment:    Healthy female exam.  Doing well.  PCOS surveillance.  Has loss weight.  Contraceptive surveillnce.   Plan:    Education reviewed: low fat, low cholesterol diet, safe sex/STD prevention, weight bearing exercise and management of PCOS. Contraception: NuvaRing vaginal inserts. Follow up in: 1 year. Nuva Ring and Metformin Rx

## 2013-04-07 LAB — PAP IG W/ RFLX HPV ASCU

## 2013-04-07 LAB — WET PREP BY MOLECULAR PROBE
CANDIDA SPECIES: NEGATIVE
Gardnerella vaginalis: NEGATIVE
Trichomonas vaginosis: NEGATIVE

## 2013-12-21 ENCOUNTER — Encounter: Payer: Self-pay | Admitting: Obstetrics

## 2014-04-22 ENCOUNTER — Other Ambulatory Visit: Payer: Self-pay | Admitting: Obstetrics

## 2014-05-19 ENCOUNTER — Other Ambulatory Visit: Payer: Self-pay | Admitting: Obstetrics

## 2014-05-24 ENCOUNTER — Encounter: Payer: Self-pay | Admitting: Obstetrics

## 2014-05-24 ENCOUNTER — Ambulatory Visit (INDEPENDENT_AMBULATORY_CARE_PROVIDER_SITE_OTHER): Payer: BLUE CROSS/BLUE SHIELD | Admitting: Obstetrics

## 2014-05-24 VITALS — BP 121/82 | HR 88 | Temp 98.2°F | Wt 213.0 lb

## 2014-05-24 DIAGNOSIS — E282 Polycystic ovarian syndrome: Secondary | ICD-10-CM | POA: Diagnosis not present

## 2014-05-24 DIAGNOSIS — Z3049 Encounter for surveillance of other contraceptives: Secondary | ICD-10-CM | POA: Diagnosis not present

## 2014-05-24 DIAGNOSIS — Z124 Encounter for screening for malignant neoplasm of cervix: Secondary | ICD-10-CM | POA: Diagnosis not present

## 2014-05-24 DIAGNOSIS — Z01419 Encounter for gynecological examination (general) (routine) without abnormal findings: Secondary | ICD-10-CM

## 2014-05-24 MED ORDER — METFORMIN HCL ER 500 MG PO TB24: ORAL_TABLET | ORAL | Status: AC

## 2014-05-24 MED ORDER — ETONOGESTREL-ETHINYL ESTRADIOL 0.12-0.015 MG/24HR VA RING
VAGINAL_RING | VAGINAL | Status: DC
Start: 2014-05-24 — End: 2015-05-26

## 2014-05-24 NOTE — Addendum Note (Signed)
Addended by: Marya LandryFOSTER, SUZANNE D on: 05/24/2014 04:37 PM   Modules accepted: Orders

## 2014-05-24 NOTE — Progress Notes (Signed)
Subjective:        [name] is a 29 y.o. female here for a routine exam.  Current complaints: none.    Personal health questionnaire:  Is patient Ashkenazi Jewish, have a family history of breast and/or ovarian cancer: no Is there a family history of uterine cancer diagnosed at age < 3450, gastrointestinal cancer, urinary tract cancer, family member who is a Personnel officerLynch syndrome-associated carrier: no Is the patient overweight and hypertensive, family history of diabetes, personal history of gestational diabetes, preeclampsia or PCOS: no Is patient over 755, have PCOS,  family history of premature CHD under age 27, diabetes, smoke, have hypertension or peripheral artery disease:  no At any time, has a partner hit, kicked or otherwise hurt or frightened you?: no Over the past 2 weeks, have you felt down, depressed or hopeless?: no Over the past 2 weeks, have you felt little interest or pleasure in doing things?:no   Gynecologic History Patient's last menstrual period was 05/12/2014. Contraception: NuvaRing vaginal inserts Last Pap: 2015. Results were: normal Last mammogram: n/a. Results were: n/a  Obstetric History OB History  Gravida Para Term Preterm AB SAB TAB Ectopic Multiple Living  2 2 2  0 0 0 0 0 0 2    # Outcome Date GA Lbr Len/2nd Weight Sex Delivery Anes PTL Lv  2 Term 01/03/12 471w2d  7 lb 10.9 oz (3.485 kg) F CS-LTranv Spinal  Y  1 Term 2009    M CS-Unspec   Y      Past Medical History  Diagnosis Date  . PCOS (polycystic ovarian syndrome)   . PONV (postoperative nausea and vomiting)     Past Surgical History  Procedure Laterality Date  . Cesarean section  2009  . Tonsillectomy    . Cesarean section  01/03/2012    Procedure: CESAREAN SECTION;  Surgeon: Brock Badharles A Kimmarie Pascale, MD;  Location: [facility];  Service: Obstetrics;  Laterality: N/A;  Prev c/s 01/10/12     Current outpatient prescriptions:  .  etonogestrel-ethinyl estradiol (NUVARING) 0.12-0.015 MG/24HR  vaginal ring, INSERT 1 RING VAGINALLY FOR 3 WEEKS THEN 1 WEEK OFF, Disp: 1 each, Rfl: 11 .  metFORMIN (GLUCOPHAGE-XR) 500 MG 24 hr tablet, TAKE 3 TABLETS BY MOUTH EVERY DAY AFTE SUPPER, Disp: 90 tablet, Rfl: 11 No Known Allergies  History  Substance Use Topics  . Smoking status: Former Smoker    Quit date: 12/31/2009  . Smokeless tobacco: Not on file  . Alcohol Use: Yes     Comment: occasional    History reviewed. No pertinent family history.    Review of Systems  Constitutional: negative for fatigue and weight loss Respiratory: negative for cough and wheezing Cardiovascular: negative for chest pain, fatigue and palpitations Gastrointestinal: negative for abdominal pain and change in bowel habits Musculoskeletal:negative for myalgias Neurological: negative for gait problems and tremors Behavioral/Psych: negative for abusive relationship, depression Endocrine: negative for temperature intolerance   Genitourinary:negative for abnormal menstrual periods, genital lesions, hot flashes, sexual problems and vaginal discharge Integument/breast: negative for breast lump, breast tenderness, nipple discharge and skin lesion(s)    Objective:       BP 121/82 mmHg  Pulse 88  Temp(Src) 98.2 F (36.8 C)  Wt 213 lb (96.616 kg)  LMP 05/12/2014 General:   alert  Skin:   no rash or abnormalities  Lungs:   clear to auscultation bilaterally  Heart:   regular rate and rhythm, S1, S2 normal, no murmur, click, rub or gallop  Breasts:  normal without suspicious masses, skin or nipple changes or axillary nodes  Abdomen:  normal findings: no organomegaly, soft, non-tender and no hernia  Pelvis:  External genitalia: normal general appearance Urinary system: urethral meatus normal and bladder without fullness, nontender Vaginal: normal without tenderness, induration or masses Cervix: normal appearance Adnexa: normal bimanual exam Uterus: anteverted and non-tender, normal size   Lab Review Urine  pregnancy test Labs reviewed yes Radiologic studies reviewed no    Assessment:    Healthy female exam.    Contraceptive Surveillance   Plan:    Education reviewed: low fat, low cholesterol diet, safe sex/STD prevention, self breast exams and weight bearing exercise. Contraception: NuvaRing vaginal inserts. Follow up in: 1 year.   Meds ordered this encounter  Medications  . metFORMIN (GLUCOPHAGE-XR) 500 MG 24 hr tablet    Sig: TAKE 3 TABLETS BY MOUTH EVERY DAY AFTE SUPPER    Dispense:  90 tablet    Refill:  11  . etonogestrel-ethinyl estradiol (NUVARING) 0.12-0.015 MG/24HR vaginal ring    Sig: INSERT 1 RING VAGINALLY FOR 3 WEEKS THEN 1 WEEK OFF    Dispense:  1 each    Refill:  11   No orders of the defined types were placed in this encounter.

## 2014-05-25 LAB — PAP IG W/ RFLX HPV ASCU

## 2014-05-27 LAB — SURESWAB, VAGINOSIS/VAGINITIS PLUS
Atopobium vaginae: NOT DETECTED Log (cells/mL)
C. ALBICANS, DNA: NOT DETECTED
C. GLABRATA, DNA: NOT DETECTED
C. TRACHOMATIS RNA, TMA: NOT DETECTED
C. TROPICALIS, DNA: NOT DETECTED
C. parapsilosis, DNA: DETECTED — AB
Gardnerella vaginalis: <4.7 Log (cells/mL)
LACTOBACILLUS SPECIES: 8 Log (cells/mL)
MEGASPHAERA SPECIES: NOT DETECTED Log (cells/mL)
N. GONORRHOEAE RNA, TMA: NOT DETECTED
T. vaginalis RNA, QL TMA: NOT DETECTED

## 2014-05-28 ENCOUNTER — Other Ambulatory Visit: Payer: Self-pay | Admitting: Obstetrics

## 2014-05-28 DIAGNOSIS — B3731 Acute candidiasis of vulva and vagina: Secondary | ICD-10-CM

## 2014-05-28 DIAGNOSIS — B373 Candidiasis of vulva and vagina: Secondary | ICD-10-CM

## 2014-05-28 MED ORDER — FLUCONAZOLE 150 MG PO TABS: 150.0000 mg | ORAL_TABLET | Freq: Once | ORAL | Status: DC

## 2014-06-21 ENCOUNTER — Encounter: Payer: Self-pay | Admitting: *Deleted

## 2015-05-26 ENCOUNTER — Ambulatory Visit: Payer: BLUE CROSS/BLUE SHIELD | Admitting: Obstetrics

## 2015-05-26 ENCOUNTER — Encounter: Payer: Self-pay | Admitting: Obstetrics

## 2015-05-26 ENCOUNTER — Ambulatory Visit (INDEPENDENT_AMBULATORY_CARE_PROVIDER_SITE_OTHER): Payer: BLUE CROSS/BLUE SHIELD | Admitting: Obstetrics

## 2015-05-26 VITALS — BP 118/77 | HR 77 | Temp 98.7°F | Wt 187.0 lb

## 2015-05-26 DIAGNOSIS — Z1389 Encounter for screening for other disorder: Secondary | ICD-10-CM

## 2015-05-26 DIAGNOSIS — Z01419 Encounter for gynecological examination (general) (routine) without abnormal findings: Secondary | ICD-10-CM

## 2015-05-26 DIAGNOSIS — Z3049 Encounter for surveillance of other contraceptives: Secondary | ICD-10-CM

## 2015-05-26 DIAGNOSIS — Z3040 Encounter for surveillance of contraceptives, unspecified: Secondary | ICD-10-CM

## 2015-05-26 LAB — POCT URINALYSIS DIPSTICK
Bilirubin, UA: NEGATIVE
Glucose, UA: NEGATIVE
KETONES UA: NEGATIVE
Leukocytes, UA: NEGATIVE
Nitrite, UA: NEGATIVE
PH UA: 5
PROTEIN UA: NEGATIVE
SPEC GRAV UA: 1.02
UROBILINOGEN UA: NEGATIVE

## 2015-05-26 MED ORDER — ETONOGESTREL-ETHINYL ESTRADIOL 0.12-0.015 MG/24HR VA RING: VAGINAL_RING | VAGINAL | Status: DC

## 2015-05-26 NOTE — Progress Notes (Signed)
Subjective:        [name] is a 30 y.o. female here for a routine exam.  Current complaints: None.    Personal health questionnaire:  Is patient Ashkenazi Jewish, have a family history of breast and/or ovarian cancer: no Is there a family history of uterine cancer diagnosed at age < 46, gastrointestinal cancer, urinary tract cancer, family member who is a Personnel officer syndrome-associated carrier: no Is the patient overweight and hypertensive, family history of diabetes, personal history of gestational diabetes, preeclampsia or PCOS: no Is patient over 5, have PCOS,  family history of premature CHD under age 20, diabetes, smoke, have hypertension or peripheral artery disease:  no At any time, has a partner hit, kicked or otherwise hurt or frightened you?: no Over the past 2 weeks, have you felt down, depressed or hopeless?: no Over the past 2 weeks, have you felt little interest or pleasure in doing things?:no   Gynecologic History No LMP recorded. Contraception: NuvaRing vaginal inserts Last Pap: 2016. Results were: normal Last mammogram: n/a. Results were: n/a  Obstetric History OB History  Gravida Para Term Preterm AB SAB TAB Ectopic Multiple Living  0 0 0 0 0 0 2    # Outcome Date GA Lbr Len/2nd Weight Sex Delivery Anes PTL Lv  2 Term 01/03/12 [redacted]w[redacted]d  7 lb 10.9 oz (3.485 kg) F CS-LTranv Spinal  Y  1 Term 2009    M CS-Unspec   Y      Past Medical History  Diagnosis Date  . PCOS (polycystic ovarian syndrome)   . PONV (postoperative nausea and vomiting)     Past Surgical History  Procedure Laterality Date  . Cesarean section  2009  . Tonsillectomy    . Cesarean section  01/03/2012    Procedure: CESAREAN SECTION;  Surgeon: Brock Bad, MD;  Location: [facility];  Service: Obstetrics;  Laterality: N/A;  Prev c/s 01/10/12     Current outpatient prescriptions:  .  etonogestrel-ethinyl estradiol (NUVARING) 0.12-0.015 MG/24HR vaginal ring, INSERT 1 RING  VAGINALLY FOR 3 WEEKS THEN 1 WEEK OFF, Disp: 1 each, Rfl: 11 .  metFORMIN (GLUCOPHAGE-XR) 500 MG 24 hr tablet, TAKE 3 TABLETS BY MOUTH EVERY DAY AFTE SUPPER (Patient not taking: Reported on 05/26/2015), Disp: 90 tablet, Rfl: 11 No Known Allergies  Social History  Substance Use Topics  . Smoking status: Former Smoker    Quit date: 12/31/2009  . Smokeless tobacco: Not on file  . Alcohol Use: Yes     Comment: occasional    History reviewed. No pertinent family history.    Review of Systems  Constitutional: negative for fatigue and weight loss Respiratory: negative for cough and wheezing Cardiovascular: negative for chest pain, fatigue and palpitations Gastrointestinal: negative for abdominal pain and change in bowel habits Musculoskeletal:negative for myalgias Neurological: negative for gait problems and tremors Behavioral/Psych: negative for abusive relationship, depression Endocrine: negative for temperature intolerance   Genitourinary:negative for abnormal menstrual periods, genital lesions, hot flashes, sexual problems and vaginal discharge Integument/breast: negative for breast lump, breast tenderness, nipple discharge and skin lesion(s)    Objective:       BP 118/77 mmHg  Pulse 77  Temp(Src) 98.7 F (37.1 C)  Wt 187 lb (84.823 kg) General:   alert  Skin:   no rash or abnormalities  Lungs:   clear to auscultation bilaterally  Heart:   regular rate and rhythm, S1, S2 normal, no murmur, click, rub or gallop  Breasts:  normal without suspicious masses, skin or nipple changes or axillary nodes  Abdomen:  normal findings: no organomegaly, soft, non-tender and no hernia  Pelvis:  External genitalia: normal general appearance Urinary system: urethral meatus normal and bladder without fullness, nontender Vaginal: normal without tenderness, induration or masses Cervix: normal appearance Adnexa: normal bimanual exam Uterus: anteverted and non-tender, normal size   Lab  Review Urine pregnancy test Labs reviewed yes Radiologic studies reviewed no   Assessment:    Healthy female exam.    Plan:    Education reviewed: low fat, low cholesterol diet, safe sex/STD prevention, self breast exams and weight bearing exercise. Contraception: NuvaRing vaginal inserts. Follow up in: 1 year.   No orders of the defined types were placed in this encounter.   Orders Placed This Encounter  Procedures  . POCT urinalysis dipstick

## 2015-05-30 LAB — NUSWAB VAGINITIS PLUS (VG+)
CANDIDA ALBICANS, NAA: NEGATIVE
CANDIDA GLABRATA, NAA: NEGATIVE
CHLAMYDIA TRACHOMATIS, NAA: NEGATIVE
Neisseria gonorrhoeae, NAA: NEGATIVE
TRICH VAG BY NAA: NEGATIVE

## 2015-06-01 LAB — PAP IG W/ RFLX HPV ASCU: PAP SMEAR COMMENT: 0

## 2016-05-29 ENCOUNTER — Ambulatory Visit (INDEPENDENT_AMBULATORY_CARE_PROVIDER_SITE_OTHER): Payer: BLUE CROSS/BLUE SHIELD | Admitting: Obstetrics

## 2016-05-29 ENCOUNTER — Encounter: Payer: Self-pay | Admitting: Obstetrics

## 2016-05-29 VITALS — BP 112/80 | HR 68 | Temp 98.8°F | Wt 188.0 lb

## 2016-05-29 DIAGNOSIS — Z01419 Encounter for gynecological examination (general) (routine) without abnormal findings: Secondary | ICD-10-CM

## 2016-05-29 DIAGNOSIS — Z3044 Encounter for surveillance of vaginal ring hormonal contraceptive device: Secondary | ICD-10-CM

## 2016-05-29 DIAGNOSIS — Z124 Encounter for screening for malignant neoplasm of cervix: Secondary | ICD-10-CM

## 2016-05-29 DIAGNOSIS — Z3049 Encounter for surveillance of other contraceptives: Secondary | ICD-10-CM

## 2016-05-29 DIAGNOSIS — Z0000 Encounter for general adult medical examination without abnormal findings: Secondary | ICD-10-CM

## 2016-05-29 MED ORDER — ETONOGESTREL-ETHINYL ESTRADIOL 0.12-0.015 MG/24HR VA RING: VAGINAL_RING | VAGINAL | 11 refills | Status: DC

## 2016-05-29 NOTE — Progress Notes (Signed)
Patient presents for annual exam

## 2016-05-29 NOTE — Progress Notes (Signed)
Subjective:        [name] is a 31 y.o. female here for a routine exam.  Current complaints: None.    Personal health questionnaire:  Is patient Ashkenazi Jewish, have a family history of breast and/or ovarian cancer: no Is there a family history of uterine cancer diagnosed at age < 26, gastrointestinal cancer, urinary tract cancer, family member who is a Personnel officer syndrome-associated carrier: no Is the patient overweight and hypertensive, family history of diabetes, personal history of gestational diabetes, preeclampsia or PCOS: no Is patient over 44, have PCOS,  family history of premature CHD under age 40, diabetes, smoke, have hypertension or peripheral artery disease:  no At any time, has a partner hit, kicked or otherwise hurt or frightened you?: no Over the past 2 weeks, have you felt down, depressed or hopeless?: no Over the past 2 weeks, have you felt little interest or pleasure in doing things?:no   Gynecologic History Patient's last menstrual period was 04/29/2016 (approximate). Contraception: NuvaRing vaginal inserts Last Pap: 2017. Results were: normal Last mammogram: n/a. Results were: n/a  Obstetric History OB History  Gravida Para Term Preterm AB Living  0 0 2  SAB TAB Ectopic Multiple Live Births  0 0 0 0 2    # Outcome Date GA Lbr Len/2nd Weight Sex Delivery Anes PTL Lv  2 Term 01/03/12 [redacted]w[redacted]d  7 lb 10.9 oz (3.485 kg) F CS-LTranv Spinal  LIV  1 Term 2009    M CS-Unspec   LIV      Past Medical History:  Diagnosis Date  . PCOS (polycystic ovarian syndrome)   . PONV (postoperative nausea and vomiting)     Past Surgical History:  Procedure Laterality Date  . CESAREAN SECTION  2009  . CESAREAN SECTION  01/03/2012   Procedure: CESAREAN SECTION;  Surgeon: Brock Bad, MD;  Location: [facility];  Service: Obstetrics;  Laterality: N/A;  Prev c/s 01/10/12  . TONSILLECTOMY       Current Outpatient Prescriptions:  .  etonogestrel-ethinyl  estradiol (NUVARING) 0.12-0.015 MG/24HR vaginal ring, INSERT 1 RING VAGINALLY FOR 3 WEEKS THEN 1 WEEK OFF, Disp: 1 each, Rfl: 11 .  metFORMIN (GLUCOPHAGE-XR) 500 MG 24 hr tablet, TAKE 3 TABLETS BY MOUTH EVERY DAY AFTE SUPPER (Patient not taking: Reported on 05/26/2015), Disp: 90 tablet, Rfl: 11 No Known Allergies  Social History  Substance Use Topics  . Smoking status: Former Smoker    Quit date: 12/31/2009  . Smokeless tobacco: Never Used  . Alcohol use Yes     Comment: occasional    History reviewed. No pertinent family history.    Review of Systems  Constitutional: negative for fatigue and weight loss Respiratory: negative for cough and wheezing Cardiovascular: negative for chest pain, fatigue and palpitations Gastrointestinal: negative for abdominal pain and change in bowel habits Musculoskeletal:negative for myalgias Neurological: negative for gait problems and tremors Behavioral/Psych: negative for abusive relationship, depression Endocrine: negative for temperature intolerance    Genitourinary:negative for abnormal menstrual periods, genital lesions, hot flashes, sexual problems and vaginal discharge Integument/breast: negative for breast lump, breast tenderness, nipple discharge and skin lesion(s)    Objective:       BP 112/80   Pulse 68   Temp 98.8 F (37.1 C)   Wt 188 lb (85.3 kg)   LMP 04/29/2016 (Approximate)   Breastfeeding? No   BMI 30.34 kg/m  General:   alert  Skin:   no rash or abnormalities  Lungs:  clear to auscultation bilaterally  Heart:   regular rate and rhythm, S1, S2 normal, no murmur, click, rub or gallop  Breasts:   normal without suspicious masses, skin or nipple changes or axillary nodes  Abdomen:  normal findings: no organomegaly, soft, non-tender and no hernia  Pelvis:  External genitalia: normal general appearance Urinary system: urethral meatus normal and bladder without fullness, nontender Vaginal: normal without tenderness, induration  or masses Cervix: normal appearance Adnexa: normal bimanual exam Uterus: anteverted and non-tender, normal size   Lab Review Urine pregnancy test Labs reviewed yes Radiologic studies reviewed no  50% of 20 min visit spent on counseling and coordination of care.    Assessment:    Healthy female exam.    Contraceptive Surveillance.  Pleased with NuvaRing.   Plan:    Education reviewed: calcium supplements, depression evaluation, low fat, low cholesterol diet, safe sex/STD prevention, self breast exams and weight bearing exercise. Contraception: NuvaRing vaginal inserts. Follow up in: 1 year.   No orders of the defined types were placed in this encounter.  No orders of the defined types were placed in this encounter.    Patient ID: [name], female   DOB: Jul 18, 1985, 31 y.o.   MRN: [id number]

## 2016-05-30 ENCOUNTER — Other Ambulatory Visit: Payer: Self-pay | Admitting: Obstetrics

## 2016-05-30 DIAGNOSIS — B3731 Acute candidiasis of vulva and vagina: Secondary | ICD-10-CM

## 2016-05-30 DIAGNOSIS — B373 Candidiasis of vulva and vagina: Secondary | ICD-10-CM

## 2016-05-30 LAB — CERVICOVAGINAL ANCILLARY ONLY
Bacterial vaginitis: NEGATIVE
CHLAMYDIA, DNA PROBE: NEGATIVE
Candida vaginitis: POSITIVE — AB
Neisseria Gonorrhea: NEGATIVE
TRICH (WINDOWPATH): NEGATIVE

## 2016-05-30 MED ORDER — FLUCONAZOLE 150 MG PO TABS: 150.0000 mg | ORAL_TABLET | Freq: Once | ORAL | 0 refills | Status: AC

## 2016-05-31 LAB — CYTOLOGY - PAP: Diagnosis: NEGATIVE

## 2017-07-02 ENCOUNTER — Other Ambulatory Visit: Payer: Self-pay | Admitting: Obstetrics

## 2017-07-02 DIAGNOSIS — Z3049 Encounter for surveillance of other contraceptives: Secondary | ICD-10-CM

## 2017-08-15 ENCOUNTER — Telehealth: Payer: Self-pay | Admitting: Obstetrics

## 2017-11-06 DIAGNOSIS — E039 Hypothyroidism, unspecified: Secondary | ICD-10-CM | POA: Insufficient documentation

## 2018-02-27 DIAGNOSIS — Z98891 History of uterine scar from previous surgery: Secondary | ICD-10-CM | POA: Insufficient documentation

## 2018-04-09 NOTE — Telephone Encounter (Signed)
Error

## 2019-01-24 ENCOUNTER — Other Ambulatory Visit: Payer: Self-pay

## 2019-01-24 ENCOUNTER — Emergency Department
Admission: EM | Admit: 2019-01-24 | Discharge: 2019-01-24 | Disposition: A | Discharge status: Home / Self Care | Payer: BC Managed Care – PPO | Source: Home / Self Care | Attending: Family Medicine | Admitting: Family Medicine

## 2019-01-24 DIAGNOSIS — J029 Acute pharyngitis, unspecified: Secondary | ICD-10-CM

## 2019-01-24 DIAGNOSIS — Z9189 Other specified personal risk factors, not elsewhere classified: Secondary | ICD-10-CM

## 2019-01-24 NOTE — ED Provider Notes (Signed)
[facility]    CSN: [id number] Arrival date & time: 01/24/19  3149      History   Chief Complaint Chief Complaint  Patient presents with  . Sore Throat  . Otalgia    LT    HPI [name] is a 33 y.o. female.   Patient awoke with sinus congestion three days ago.  Her infant son had developed viral URI symptoms three days earlier.  Yesterday she awoke with a sore throat that has persisted.  She had a slight cough last night.  She denies fevers, chills, and sweats and feels well otherwise.   She denies chest tightness, shortness of breath, and changes in taste/smell.  She states that she had frequent strep throat infections as a child    The history is provided by the patient.    Past Medical History:  Diagnosis Date  . PCOS (polycystic ovarian syndrome)   . PONV (postoperative nausea and vomiting)     Patient Active Problem List   Diagnosis Date Noted  . Severe obesity (BMI 35.0-39.9) with comorbidity ([place]) 01/24/2019  . History of cesarean section, low transverse 02/27/2018  . Hypothyroidism 11/06/2017  . PCOS (polycystic ovarian syndrome) 04/06/2013    Past Surgical History:  Procedure Laterality Date  . CESAREAN SECTION  2009  . CESAREAN SECTION  01/03/2012   Procedure: CESAREAN SECTION;  Surgeon: Shelly Bombard, MD;  Location: [facility];  Service: Obstetrics;  Laterality: N/A;  Prev c/s 01/10/12  . TONSILLECTOMY      OB History    Gravida  2   Para  2   Term  2   Preterm  0   AB  0   Living  2     SAB  0   TAB  0   Ectopic  0   Multiple  0   Live Births  2            Home Medications    Prior to Admission medications   Medication Sig Start Date End Date Taking? Authorizing Provider  etonogestrel-ethinyl estradiol (NUVARING) 0.12-0.015 MG/24HR vaginal ring INSERT 1 RING IN THE VAGINA FOR 3 WEEKS THEN 1 WEEK OFF 07/03/17   Shelly Bombard, MD  metFORMIN (GLUCOPHAGE-XR) 500 MG 24 hr tablet TAKE 3 TABLETS BY MOUTH  EVERY DAY AFTE SUPPER Patient not taking: Reported on 05/26/2015 05/24/14   Shelly Bombard, MD    Family History History reviewed. No pertinent family history.  Social History Social History   Tobacco Use  . Smoking status: Former Smoker    Quit date: 12/31/2009    Years since quitting: 9.0  . Smokeless tobacco: Never Used  Substance Use Topics  . Alcohol use: Yes    Comment: occasional  . Drug use: No     Allergies   Patient has no known allergies.   Review of Systems Review of Systems + sore throat ? cough No pleuritic pain No wheezing + nasal congestion No post-nasal drainage No sinus pain/pressure No itchy/red eyes ? Left earache No hemoptysis No SOB No fever/chills No nausea No vomiting No abdominal pain No diarrhea No urinary symptoms No skin rash No fatigue No myalgias No headache Used OTC meds without relief   Physical Exam Triage Vital Signs ED Triage Vitals [01/24/19 0950]  Enc Vitals Group     BP 120/84     Pulse Rate 71     Resp 18     Temp 98.3 F (36.8 C)  Temp Source Oral     SpO2 98 %     Weight 245 lb (111.1 kg)     Height 5' 6" (1.676 m)     Head Circumference      Peak Flow      Pain Score 2     Pain Loc      Pain Edu?      Excl. in [place]?    No data found.  Updated Vital Signs BP 120/84 (BP Location: Right Arm)   Pulse 71   Temp 98.3 F (36.8 C) (Oral)   Resp 18   Ht 5' 6" (1.676 m)   Wt 111.1 kg   SpO2 98%   BMI 39.54 kg/m   Visual Acuity Right Eye Distance:   Left Eye Distance:   Bilateral Distance:    Right Eye Near:   Left Eye Near:    Bilateral Near:     Physical Exam Nursing notes and Vital Signs reviewed. Appearance:  Patient appears stated age, and in no acute distress Eyes:  Pupils are equal, round, and reactive to light and accomodation.  Extraocular movement is intact.  Conjunctivae are not inflamed  Ears:  Canals normal.  Tympanic membranes normal.  Nose:  Mildly congested turbinates.  No  sinus tenderness.   Pharynx:  Minimal erythema left side.  No exudate Neck:  Supple.  Mildly tender left tonsillar node, not enlarged.  Lungs:  Clear to auscultation.  Breath sounds are equal.  Moving air well. Heart:  Regular rate and rhythm without murmurs, rubs, or gallops.  Abdomen:  Nontender without masses or hepatosplenomegaly.  Bowel sounds are present.  No CVA or flank tenderness.  Extremities:  No edema.  Skin:  No rash present.   UC Treatments / Results  Labs (all labs ordered are listed, but only abnormal results are displayed) Labs Reviewed  STREP A DNA PROBE  SARS-COV-2 RNA,(COVID-19) QUALITATIVE NAAT  POCT RAPID STREP A (OFFICE) negative    EKG   Radiology No results found.  Procedures Procedures (including critical care time)  Medications Ordered in UC Medications - No data to display  Initial Impression / Assessment and Plan / UC Course  I have reviewed the triage vital signs and the nursing notes.  Pertinent labs & imaging results that were available during my care of the patient were reviewed by me and considered in my medical decision making (see chart for details).    Suspect viral URI.  Treat symptomatically for now. Throat culture pending. COVID19 send out.   Final Clinical Impressions(s) / UC Diagnoses   Final diagnoses:  At increased risk of exposure to COVID-19 virus  Acute pharyngitis, unspecified etiology     Discharge Instructions     If cold-like symptoms develop, try the following: Take plain guaifenesin (1285m extended release tabs such as Mucinex) twice daily, with plenty of water, for cough and congestion.  May add Pseudoephedrine (362m one or two every 4 to 6 hours) for sinus congestion.  Get adequate rest.   May use Afrin nasal spray (or generic oxymetazoline) each morning for about 5 days and then discontinue.  Also recommend using saline nasal spray several times daily and saline nasal irrigation (AYR is a common brand).  Use  Flonase nasal spray each morning after using Afrin nasal spray and saline nasal irrigation. Try warm salt water gargles for sore throat.  Stop all antihistamines for now, and other non-prescription cough/cold preparations. May take Ibuprofen 20055m4 tabs every 8 hours with  food for sore throat. May take Delsym Cough Suppressant at bedtime for nighttime cough.   If throat culture is positive, an antibiotic will be prescribed.  Isolate yourself until COVID-19 test result is available.   If COVID-19 test is positive, isolate yourself until the below conditions are met: 1)  At least 7 days since symptoms onset. AND 2)  > 72 hours after symptom resolution (absence of fever without the use of fever-reducing medicine, and improvement in respiratory symptoms.          ED Prescriptions    None        Kandra Nicolas, MD 01/30/19 1042

## 2019-01-24 NOTE — Discharge Instructions (Addendum)
If cold-like symptoms develop, try the following: Take plain guaifenesin (1273m extended release tabs such as Mucinex) twice daily, with plenty of water, for cough and congestion.  May add Pseudoephedrine (331m one or two every 4 to 6 hours) for sinus congestion.  Get adequate rest.   May use Afrin nasal spray (or generic oxymetazoline) each morning for about 5 days and then discontinue.  Also recommend using saline nasal spray several times daily and saline nasal irrigation (AYR is a common brand).  Use Flonase nasal spray each morning after using Afrin nasal spray and saline nasal irrigation. Try warm salt water gargles for sore throat.  Stop all antihistamines for now, and other non-prescription cough/cold preparations. May take Ibuprofen 20028m4 tabs every 8 hours with food for sore throat. May take Delsym Cough Suppressant at bedtime for nighttime cough.   If throat culture is positive, an antibiotic will be prescribed.  Isolate yourself until COVID-19 test result is available.   If COVID-19 test is positive, isolate yourself until the below conditions are met: 1)  At least 7 days since symptoms onset. AND 2)  > 72 hours after symptom resolution (absence of fever without the use of fever-reducing medicine, and improvement in respiratory symptoms.

## 2019-01-24 NOTE — ED Triage Notes (Signed)
Pt c/o sore throat and ear pain x 2 days. Hurts to swallow. Feels similar to past strep sicknesses. Had general mgr test pos and was around him briefly on Monday. Taking Advil, tylenol and cold meds prn.

## 2019-01-25 LAB — STREP A DNA PROBE: Group A Strep Probe: NOT DETECTED

## 2019-01-27 LAB — SARS-COV-2 RNA,(COVID-19) QUALITATIVE NAAT: SARS CoV2 RNA: NOT DETECTED

## 2019-01-28 ENCOUNTER — Telehealth: Payer: Self-pay

## 2019-01-28 NOTE — Telephone Encounter (Signed)
Pt called requesting results for COVID testing. Pt says test results weren't in mychart yet. Informed of neg results.
